# Patient Record
Sex: Male | Born: 1935 | Race: White | Hispanic: No | Marital: Married | Smoking: Never smoker
Health system: Southern US, Community
[De-identification: ages and names within clinical notes are randomized; demographics above are authoritative.]

## PROBLEM LIST (undated history)

## (undated) DIAGNOSIS — G40909 Epilepsy, unspecified, not intractable, without status epilepticus: Secondary | ICD-10-CM

## (undated) DIAGNOSIS — C61 Malignant neoplasm of prostate: Secondary | ICD-10-CM

## (undated) DIAGNOSIS — J45909 Unspecified asthma, uncomplicated: Secondary | ICD-10-CM

## (undated) HISTORY — DX: Malignant neoplasm of prostate: C61

## (undated) HISTORY — DX: Unspecified asthma, uncomplicated: J45.909

## (undated) HISTORY — DX: Epilepsy, unspecified, not intractable, without status epilepticus: G40.909

## (undated) HISTORY — PX: OTHER SURGICAL HISTORY: SHX169

## (undated) HISTORY — PX: KNEE SURGERY: SHX244

## (undated) HISTORY — PX: TONSILLECTOMY: SUR1361

## (undated) HISTORY — PX: EXTERNAL EAR SURGERY: SHX627

---

## 2005-02-10 ENCOUNTER — Ambulatory Visit: Payer: Self-pay | Admitting: Internal Medicine

## 2005-04-23 ENCOUNTER — Ambulatory Visit: Payer: Self-pay | Admitting: Internal Medicine

## 2005-07-29 ENCOUNTER — Ambulatory Visit: Payer: Self-pay | Admitting: Internal Medicine

## 2005-09-22 ENCOUNTER — Ambulatory Visit: Payer: Self-pay | Admitting: Pulmonary Disease

## 2005-10-31 ENCOUNTER — Ambulatory Visit: Payer: Self-pay | Admitting: Internal Medicine

## 2005-12-30 ENCOUNTER — Ambulatory Visit: Payer: Self-pay | Admitting: Internal Medicine

## 2006-03-05 ENCOUNTER — Ambulatory Visit: Payer: Self-pay | Admitting: Internal Medicine

## 2006-08-11 ENCOUNTER — Ambulatory Visit: Payer: Self-pay | Admitting: Internal Medicine

## 2006-09-15 ENCOUNTER — Ambulatory Visit: Payer: Self-pay | Admitting: Internal Medicine

## 2007-05-13 ENCOUNTER — Ambulatory Visit: Payer: Self-pay | Admitting: Internal Medicine

## 2007-05-25 ENCOUNTER — Ambulatory Visit: Payer: Self-pay | Admitting: Internal Medicine

## 2007-09-25 DIAGNOSIS — R569 Unspecified convulsions: Secondary | ICD-10-CM | POA: Insufficient documentation

## 2007-09-25 DIAGNOSIS — J209 Acute bronchitis, unspecified: Secondary | ICD-10-CM | POA: Insufficient documentation

## 2007-09-25 DIAGNOSIS — J309 Allergic rhinitis, unspecified: Secondary | ICD-10-CM | POA: Insufficient documentation

## 2007-09-27 ENCOUNTER — Ambulatory Visit: Payer: Self-pay | Admitting: Internal Medicine

## 2008-03-14 ENCOUNTER — Ambulatory Visit: Payer: Self-pay | Admitting: Internal Medicine

## 2008-08-21 ENCOUNTER — Telehealth: Payer: Self-pay | Admitting: Internal Medicine

## 2008-09-14 ENCOUNTER — Ambulatory Visit: Payer: Self-pay | Admitting: Internal Medicine

## 2008-10-27 ENCOUNTER — Ambulatory Visit: Payer: Self-pay | Admitting: Internal Medicine

## 2009-05-09 ENCOUNTER — Ambulatory Visit: Payer: Self-pay | Admitting: Internal Medicine

## 2009-05-16 ENCOUNTER — Telehealth: Payer: Self-pay | Admitting: Internal Medicine

## 2009-09-13 ENCOUNTER — Telehealth (INDEPENDENT_AMBULATORY_CARE_PROVIDER_SITE_OTHER): Payer: Self-pay | Admitting: *Deleted

## 2009-11-06 ENCOUNTER — Ambulatory Visit: Payer: Self-pay | Admitting: Internal Medicine

## 2010-04-04 ENCOUNTER — Ambulatory Visit: Payer: Self-pay | Admitting: Internal Medicine

## 2010-06-04 ENCOUNTER — Ambulatory Visit: Payer: Self-pay | Admitting: Internal Medicine

## 2010-06-04 DIAGNOSIS — C61 Malignant neoplasm of prostate: Secondary | ICD-10-CM | POA: Insufficient documentation

## 2010-10-23 ENCOUNTER — Telehealth (INDEPENDENT_AMBULATORY_CARE_PROVIDER_SITE_OTHER): Payer: Self-pay | Admitting: *Deleted

## 2010-10-25 ENCOUNTER — Telehealth (INDEPENDENT_AMBULATORY_CARE_PROVIDER_SITE_OTHER): Payer: Self-pay | Admitting: *Deleted

## 2010-10-25 ENCOUNTER — Ambulatory Visit: Payer: Self-pay | Admitting: Internal Medicine

## 2010-10-25 DIAGNOSIS — R042 Hemoptysis: Secondary | ICD-10-CM | POA: Insufficient documentation

## 2010-10-25 DIAGNOSIS — J42 Unspecified chronic bronchitis: Secondary | ICD-10-CM | POA: Insufficient documentation

## 2010-12-27 ENCOUNTER — Ambulatory Visit
Admission: RE | Admit: 2010-12-27 | Discharge: 2010-12-27 | Payer: Self-pay | Source: Home / Self Care | Attending: Internal Medicine | Admitting: Internal Medicine

## 2011-01-02 NOTE — Progress Notes (Signed)
Summary: NEEDS APPT W/ CY   Phone Note Call from Patient Call back at Home Phone [phone]   Caller: Spouse-LINDA Bahena Call For: YOUNG Summary of Call: PT NEEDS AN APPT W/ DR YOUNG "BEFORE 12/13" [facility]).  Initial call taken by: Tivis Ringer, CNA,  October 23, 2010 2:52 PM  Follow-up for Phone Call        Spoke with pt wife and she states pt has to go to [place] to have evaluation for problems related to service. He needs a current OV to take with him to this appt. Pt needs an appt with Dr. Maple Hudson before 11-12-10. Please advise on available time.Carron Curie CMA  October 23, 2010 3:01 PM   Additional Follow-up for Phone Call Additional follow up Details #1::        Spoke with Linda(pts wife)-she will bring pt in Friday 10-25-10 at 10AM appt.Reynaldo Minium CMA  October 23, 2010 4:00 PM

## 2011-01-02 NOTE — Assessment & Plan Note (Signed)
Summary: follow up visit prior to [facility] visit/kcw   Primary Provider/Referring Provider:  Charmaine Downs in [place]  CC:  [facility].[name]  History of Present Illness:  Apr 04, 2010- COPD, allergic rhinitis, hx hemoptysis............................Patrick Kitchenwife here He has had a cold over a week. He is on Cipro now for elevated PSA and did not want to also take the Zpak he has on standby. Did nebulizer twice yesterday- not much effect. Yellow green from nose with trace blood, but no blood from chest. Started with sore throat, sinus headache. Denies fever, chills, nausea, diarrhea.  June 04, 2010- asthmatic bronchitis, allergic rhinitiis, hx hemoptysis..........Patrick Kitchenwife here CXR was clear after last here. He continues to be active outdoors, but aware of some persistent mild chest and head congestion with some white mucus that was more bothersome when he had a persistent cold in March. No more hemoptysis. Dx'd with prostate cancer and asks about surgical candidacy- PFT reviewed today. CXR was clear. PFT-Within normal limits.FEV1/FVC 0.76;  FEV1 2.93/ 100%  October 25, 2010- asthmatic bronchitis, allergic rhinitiis, hx hemoptysis..........Patrick Kitchenwife here Nurse-CC: [facility]. Was short of breath trying to cut some wood, wearing a mask. Had a cold 3-4 weeks ago- yellow sputum. He took his standby script for Z pak. , which he does 2-3x/ year.  Uses Advair 500 two times a day. Uses his rescue inhaler two times a day or more. , uses nebulizer about once daily. Occ has to stop on stairs, but can walk his own pace on level. Daily productive cough, especially at night. Saw maybe streak of blood when he had cold. Sneezes, gets some postnasal drip.  Hx of intense pneumovax reaction after his 3rd, up to date on flu vax.   Preventive Screening-Counseling & Management  Alcohol-Tobacco     Smoking Status: never  Current Medications (verified): 1)   Advair Diskus 500-50 Mcg/dose  Misc (Fluticasone-Salmeterol) .... One Puff Two Times A Day - Rinse Mouth Well After Use 2)  Lipitor 10 Mg  Tabs (Atorvastatin Calcium) .... One By Mouth Once Daily 3)  Singulair 10 Mg  Tabs (Montelukast Sodium) .... One By Mouth Once Daily 4)  Adult Aspirin Ec Low Strength 81 Mg  Tbec (Aspirin) .... Take 1 Tablet By Mouth Once A Day 5)  Mucinex 600 Mg  Tb12 (Guaifenesin) .[name].. 1-2 Every 12 Hours As Needed For Cough and Congestion 6)  Proventil Hfa 108 (90 Base) Mcg/act Aers (Albuterol Sulfate) .... 2 Puffs Four Times A Day As Needed 7)  Nebulizer For Home Meds .... As Directed 8)  Ipratropium-Albuterol 0.5-2.5 (3) Mg/50ml Soln (Ipratropium-Albuterol) .[name].. 1 Neb Four Times A Day As Needed 9)  Vitamin D (Ergocalciferol) 50000 Unit Caps (Ergocalciferol) .... Take 1 Weekly X 12 Weeks 10)  Calcium 500 Mg Tabs (Calcium) .... Take 1 By Mouth Once Daily 11)  Fosamax 70 Mg Tabs (Alendronate Sodium) .... Take 1 By Mouth Weekly 12)  Benadryl 25 Mg Tabs (Diphenhydramine Hcl) .... Take 1 By Mouth Once Daily As Needed  Allergies (verified): 1)  ! * Pna Vaccine  Past History:  Past Medical History: Last updated: 06/04/2010 HEMOPTYSIS (ICD-786.3) Hx of SEIZURE DISORDER (ICD-780.39) ASTHMATIC BRONCHITIS, ACUTE (ICD-466.0) ALLERGIC RHINITIS (ICD-477.9) Prostate cancer- 2011  Past Surgical History: Last updated: 06/04/2010 Ear surgery Tonsillectomy Knee surgery tendon repair right ankle fracture pinned  Family History: Last updated: Jun 07, 2009 Mother- died lung cancer, smoker Father- died COPD, smoker  Sister with cough and nodules  Social History: Last updated: 05/09/2009 Patient never smoked., but grew up in smoking family  Gets some care at [place] married  Risk Factors: Smoking Status: never (10/25/2010)  Review of Systems      See HPI       The patient complains of shortness of breath with activity and productive cough.  The patient denies shortness of  breath at rest, non-productive cough, coughing up blood, chest pain, irregular heartbeats, acid heartburn, indigestion, loss of appetite, weight change, abdominal pain, difficulty swallowing, sore throat, tooth/dental problems, headaches, nasal congestion/difficulty breathing through nose, sneezing, itching, ear ache, rash, change in color of mucus, and fever.    Vital Signs:  Patient profile:   76 year old male Height:      71 inches Weight:      181.13 pounds BMI:     25.35 O2 Sat:      98 % on Room air Pulse rate:   64 / minute BP sitting:   120 / 78  (left arm) Cuff size:   regular  Vitals Entered By: Reynaldo Minium CMA (October 25, 2010 9:58 AM)  O2 Flow:  Room air CC: [facility].   Physical Exam  Additional Exam:  General: A/Ox3; pleasant and cooperative, NAD, SKIN: no rash, lesions NODES: no lymphadenopathy HEENT: [place]/AT, EOM- WNL, Conjuctivae- clear, PERRLA, TM-WNL, Nose- clear, Throat- clear and wnl. Mallampati II NECK: Supple w/ fair ROM, JVD- none, normal carotid impulses w/o bruits Thyroid-  CHEST: No cough, wheeze or rhonchi now. . .  HEART: RRR, no m/g/r heard ABDOMEN: Soft  EAV:WUJW, nl pulses, no edema  NEURO: Grossly intact to observation      Impression & Recommendations:  Problem # 1:  BRONCHITIS, CHRONIC (ICD-491.9)  Current management is sufficient. He is limited by exertional dyspnea and chronic cough, but not needing additional management.  We will let him keep a Zpak in case needed.   Problem # 2:  ALLERGIC RHINITIS (ICD-477.9) Discussed sneeze and postnasal drip at this time of year as mixed etiology, not all allergy. He will try antihistamines.  His updated medication list for this problem includes:    Benadryl 25 Mg Tabs (Diphenhydramine hcl) .[name]... Take 1 by mouth once daily as needed  Problem # 3:  HEMOPTYSIS UNSPECIFIED (ICD-786.30)  He has had hemoprrhagic bronchitis before and never smoked.  CXR was clear of  aciute concern in the Spring. I don't think we need to repeat it this early.  His updated medication list for this problem includes:    Advair Diskus 500-50 Mcg/dose Misc (Fluticasone-salmeterol) ..... One puff two times a day - rinse mouth well after use    Singulair 10 Mg Tabs (Montelukast sodium) ..... One by mouth once daily    Proventil Hfa 108 (90 Base) Mcg/act Aers (Albuterol sulfate) .[name]... 2 puffs four times a day as needed    Ipratropium-albuterol 0.5-2.5 (3) Mg/65ml Soln (Ipratropium-albuterol) .[name]... 1 neb four times a day as needed    Vitamin D (ergocalciferol) 50000 Unit Caps (Ergocalciferol) .[name]... Take 1 weekly x 12 weeks    Calcium 500 Mg Tabs (Calcium) .[name]... Take 1 by mouth once daily    Zithromax Z-pak 250 Mg Tabs (Azithromycin) .[name]... 2 today then one daily  Medications Added to Medication List This Visit: 1)  Calcium 500 Mg Tabs (Calcium) .... Take 1 by mouth once daily 2)  Fosamax 70 Mg Tabs (Alendronate sodium) .... Take 1 by mouth weekly 3)  Benadryl 25 Mg Tabs (  Diphenhydramine hcl) .... Take 1 by mouth once daily as needed 4)  Zithromax Z-pak 250 Mg Tabs (Azithromycin) .... 2 today then one daily  Other Orders: Est. Patient Level IV (13086)  Patient Instructions: 1)  Please schedule a follow-up appointment in 6 months. Call sooner if needed. 2)  Continue present meds 3)  Script for Zpak to hold in case of infection 4)  You are up to date on flu and pneumonia vaccine. Do not repeat the pneumonia vaccine since you had the reaction before. Prescriptions: ZITHROMAX Z-PAK 250 MG TABS (AZITHROMYCIN) 2 today then one daily  #1 pak x 3   Entered and Authorized by:   Waymon Budge MD   Signed by:   Waymon Budge MD on 10/25/2010   Method used:   Print then Give to Patient   RxID:   [id number]

## 2011-01-02 NOTE — Assessment & Plan Note (Signed)
Summary: chest congestion/ mbw   Primary Provider/Referring Provider:  Charmaine Downs in [place]  CC:  nasal pressure; chest congestion-yellow in color;worse at night. .  History of Present Illness: 10/27/08- COPD, allergic rhinitis, hx hemoptysis By late evening everday has developed chest congestion as he lies down. Pains left lower anterior ribs. had similar discomforts off and on before. Had bad cod 2 weeks ago w/ yellow sputum- took amoxacillin. Coughing clear mucus now. Avoids his cat. Has had flu vaccine.  05/10/09- COPD, allergic rhinitis, hx hemoptysis. ................. Wife here Had to go to primary doctor 3 weeks ago for pred taper, neb and Zpak. Blames the season change. Has not fully cleared  Thumping on his chest helps clear mucus. Uses mucinex. White sputum. Denies fever, blood,  chest pain. Felt much better on less humid days. has Flutter device, not using.  November 06, 2009- COPD, allergic rhinitis, hx hemoptysis..................................Marland Kitchenwife here Had a bad cold a month ago.  Has been having more trouble clearing thick mucus from left chest. He wears a dust mask to get up leaves. Cold air bothers. Uses nebulizer- helps when needed. Right now he feels well. Had flu vax. Has had pneumovax- had big red arm.  We gave Z pak that helped earlier in the Fall.  Apr 04, 2010- COPD, allergic rhinitis, hx hemoptysis............................Marland Kitchenwife here He has had a cold over a week. He is on Cipro now for elevated PSA and did not want to also take the Zpak he has on standby. Did nebulizer twice yesterday- not much effect. Yellow green from nose with trace blood, but no blood from chest. Started with sore throat, sinus headache. Denies fever, chills, nausea, diarrhea.   Current Medications (verified): 1)  Advair Diskus 500-50 Mcg/dose  Misc (Fluticasone-Salmeterol) .... One Puff Two Times A Day - Rinse Mouth Well After Use 2)  Lipitor 10 Mg  Tabs (Atorvastatin Calcium) .... One By  Mouth Once Daily 3)  Singulair 10 Mg  Tabs (Montelukast Sodium) .... One By Mouth Once Daily 4)  Adult Aspirin Ec Low Strength 81 Mg  Tbec (Aspirin) .... Take 1 Tablet By Mouth Once A Day 5)  Mucinex 600 Mg  Tb12 (Guaifenesin) .Marland Kitchen.. 1-2 Every 12 Hours As Needed For Cough and Congestion 6)  Proventil Hfa 108 (90 Base) Mcg/act Aers (Albuterol Sulfate) .... 2 Puffs Four Times A Day As Needed 7)  Nebulizer For Home Meds .... As Directed 8)  Ipratropium-Albuterol 0.5-2.5 (3) Mg/9ml Soln (Ipratropium-Albuterol) .Marland Kitchen.. 1 Neb Four Times A Day As Needed 9)  Vitamin D (Ergocalciferol) 50000 Unit Caps (Ergocalciferol) .... Take 1 Weekly X 12 Weeks  Allergies (verified): 1)  ! * Pna Vaccine  Past History:  Past Medical History: Last updated: 03/14/2008 HEMOPTYSIS (ICD-786.3) Hx of SEIZURE DISORDER (ICD-780.39) ASTHMATIC BRONCHITIS, ACUTE (ICD-466.0) ALLERGIC RHINITIS (ICD-477.9)  Past Surgical History: Last updated: 09/14/2008 Ear surgery  Family History: Last updated: May 26, 2009 Mother- died lung cancer, smoker Father- died COPD, smoker  Sister with cough and nodules  Social History: Last updated: 05-26-2009 Patient never smoked., but grew up in smoking family  Gets some care at [place] married  Risk Factors: Smoking Status: never (03/14/2008)  Review of Systems      See HPI  The patient denies anorexia, fever, weight loss, weight gain, vision loss, decreased hearing, hoarseness, chest pain, syncope, dyspnea on exertion, peripheral edema, prolonged cough, headaches, hemoptysis, and severe indigestion/heartburn.    Vital Signs:  Patient profile:   76 year old male Weight:  184 pounds O2 Sat:      96 % on Room air Pulse rate:   66 / minute BP sitting:   144 / 82  (left arm) Cuff size:   regular  Vitals Entered By: Reynaldo Minium CMA (Apr 04, 2010 10:24 AM)  O2 Flow:  Room air  Physical Exam  Additional Exam:  General: A/Ox3; pleasant and cooperative, NAD, SKIN: no rash,  lesions NODES: no lymphadenopathy HEENT: [place]/AT, EOM- WNL, Conjuctivae- clear, PERRLA, TM-WNL, Nose- clear, Throat- clear and wnl. Mallampati II NECK: Supple w/ fair ROM, JVD- none, normal carotid impulses w/o bruits Thyroid-  CHEST: Coarse breath sounds and minimal crackles in left lower and mid lung.  Raspy exhalation.  HEART: RRR, no m/g/r heard ABDOMEN: Soft  VWU:JWJX, nl pulses, no edema  NEURO: Grossly intact to observation      Impression & Recommendations:  Problem # 1:  ASTHMATIC BRONCHITIS, ACUTE (ICD-466.0)  Acute exacerbation of COPD. This is likely a viral infection. His Cipro would cover potential atypicals. We will give neb and depo, get CXR, schedule PFT later to update file anticipating he may need prostate surgery later. The following medications were removed from the medication list:    Zithromax Z-pak 250 Mg Tabs (Azithromycin) .Marland Kitchen... 2 today then one daily    Clarithromycin 500 Mg Tabs (Clarithromycin) .Marland Kitchen... 1 twice daily after meals His updated medication list for this problem includes:    Advair Diskus 500-50 Mcg/dose Misc (Fluticasone-salmeterol) ..... One puff two times a day - rinse mouth well after use    Singulair 10 Mg Tabs (Montelukast sodium) ..... One by mouth once daily    Mucinex 600 Mg Tb12 (Guaifenesin) .Marland Kitchen... 1-2 every 12 hours as needed for cough and congestion    Proventil Hfa 108 (90 Base) Mcg/act Aers (Albuterol sulfate) .Marland Kitchen... 2 puffs four times a day as needed    Ipratropium-albuterol 0.5-2.5 (3) Mg/66ml Soln (Ipratropium-albuterol) .Marland Kitchen... 1 neb four times a day as needed  Medications Added to Medication List This Visit: 1)  Vitamin D (ergocalciferol) 50000 Unit Caps (Ergocalciferol) .... Take 1 weekly x 12 weeks  Other Orders: Est. Patient Level III (91478) Admin of Therapeutic Inj  intramuscular or subcutaneous (29562) Depo- Medrol 80mg  (J1040) Nebulizer Tx (13086) T-2 View CXR (71020TC)  Patient Instructions: 1)  Please schedule a  follow-up appointment in 2 months. 2)  Schedule PFT 3)  A chest x-ray has been recommended.  Your imaging study may require preauthorization.  4)  Finish Cipro, take enough fluids and get enough rest 5)  The mucinex helps keep secretions thin 6)  depo 80 7)  neb xop 1.25     Medication Administration  Injection # 1:    Medication: Depo- Medrol 80mg     Diagnosis: ASTHMATIC BRONCHITIS, ACUTE (ICD-466.0)    Route: SQ    Site: RUOQ gluteus    Exp Date: 12/2010    Lot #: [id number]    Mfr: Pharmacia    Patient tolerated injection without complications    Given by: Reynaldo Minium CMA (Apr 04, 2010 10:59 AM)  Medication # 1:    Medication: Xopenex 1.25mg     Diagnosis: ASTHMATIC BRONCHITIS, ACUTE (ICD-466.0)    Dose: 1 vial    Route: inhaled    Exp Date: 08/2010    Lot #: [id number]    Mfr: Sepracor    Patient tolerated medication without complications    Given by: Reynaldo Minium CMA (Apr 04, 2010 11:00 AM)  Orders Added: 1)  Est. Patient Level III [08676] 2)  Admin of Therapeutic Inj  intramuscular or subcutaneous [96372] 3)  Depo- Medrol 80mg  [J1040] 4)  Nebulizer Tx [94640] 5)  T-2 View CXR [71020TC]

## 2011-01-02 NOTE — Miscellaneous (Signed)
Summary: Orders Update pft charges  Clinical Lists Changes  Orders: Added new Service order of Carbon Monoxide diffusing w/capacity (94720) - Signed Added new Service order of Lung Volumes (94240) - Signed Added new Service order of Spirometry (Pre & Post) (94060) - Signed 

## 2011-01-02 NOTE — Progress Notes (Signed)
  Phone Note Other Incoming   Request: Send information Summary of Call: Records release for records from 2008-present, forwarded to [facility].

## 2011-01-02 NOTE — Assessment & Plan Note (Signed)
Summary: ROV AFTER PFT ///KP   Primary Provider/Referring Provider:  Charmaine Downs in [place]  CC:  Follow up visit-Review PFTs.  History of Present Illness: 05/10/09- COPD, allergic rhinitis, hx hemoptysis. ................. Wife here Had to go to primary doctor 3 weeks ago for pred taper, neb and Zpak. Blames the season change. Has not fully cleared  Thumping on his chest helps clear mucus. Uses mucinex. White sputum. Denies fever, blood,  chest pain. Felt much better on less humid days. has Flutter device, not using.  November 06, 2009- COPD, allergic rhinitis, hx hemoptysis..................................Marland Kitchenwife here Had a bad cold a month ago.  Has been having more trouble clearing thick mucus from left chest. He wears a dust mask to get up leaves. Cold air bothers. Uses nebulizer- helps when needed. Right now he feels well. Had flu vax. Has had pneumovax- had big red arm.  We gave Z pak that helped earlier in the Fall.  Apr 04, 2010- COPD, allergic rhinitis, hx hemoptysis............................Marland Kitchenwife here He has had a cold over a week. He is on Cipro now for elevated PSA and did not want to also take the Zpak he has on standby. Did nebulizer twice yesterday- not much effect. Yellow green from nose with trace blood, but no blood from chest. Started with sore throat, sinus headache. Denies fever, chills, nausea, diarrhea.  June 04, 2010- asthmatic bronchitis, allergic rhinitiis, hx hemoptysis..........Marland Kitchenwife here CXR was clear after last here. He continues to be active outdoors, but aware of some persistent mild chest and head congestion with some white mucus that was more bothersome when he had a persistent cold in March. No more hemoptysis. Dx'd with prostate cancer and asks about surgical candidacy- PFT reviewed today. CXR was clear. PFT-Within normal limits.FEV1/FVC 0.76;  FEV1 2.93/ 100%    Preventive Screening-Counseling & Management  Alcohol-Tobacco     Smoking Status:  never  Current Medications (verified): 1)  Advair Diskus 500-50 Mcg/dose  Misc (Fluticasone-Salmeterol) .... One Puff Two Times A Day - Rinse Mouth Well After Use 2)  Lipitor 10 Mg  Tabs (Atorvastatin Calcium) .... One By Mouth Once Daily 3)  Singulair 10 Mg  Tabs (Montelukast Sodium) .... One By Mouth Once Daily 4)  Adult Aspirin Ec Low Strength 81 Mg  Tbec (Aspirin) .... Take 1 Tablet By Mouth Once A Day 5)  Mucinex 600 Mg  Tb12 (Guaifenesin) .Marland Kitchen.. 1-2 Every 12 Hours As Needed For Cough and Congestion 6)  Proventil Hfa 108 (90 Base) Mcg/act Aers (Albuterol Sulfate) .... 2 Puffs Four Times A Day As Needed 7)  Nebulizer For Home Meds .... As Directed 8)  Ipratropium-Albuterol 0.5-2.5 (3) Mg/52ml Soln (Ipratropium-Albuterol) .Marland Kitchen.. 1 Neb Four Times A Day As Needed 9)  Vitamin D (Ergocalciferol) 50000 Unit Caps (Ergocalciferol) .... Take 1 Weekly X 12 Weeks  Allergies (verified): 1)  ! * Pna Vaccine  Past History:  Family History: Last updated: 05-15-2009 Mother- died lung cancer, smoker Father- died COPD, smoker  Sister with cough and nodules  Social History: Last updated: 05/15/09 Patient never smoked., but grew up in smoking family  Gets some care at [place] married  Risk Factors: Smoking Status: never (06/04/2010)  Past Medical History: HEMOPTYSIS (ICD-786.3) Hx of SEIZURE DISORDER (ICD-780.39) ASTHMATIC BRONCHITIS, ACUTE (ICD-466.0) ALLERGIC RHINITIS (ICD-477.9) Prostate cancer- 2011  Past Surgical History: Ear surgery Tonsillectomy Knee surgery tendon repair right ankle fracture pinned  Review of Systems      See HPI       The patient  complains of non-productive cough.  The patient denies shortness of breath with activity, shortness of breath at rest, productive cough, coughing up blood, chest pain, irregular heartbeats, acid heartburn, indigestion, loss of appetite, weight change, abdominal pain, difficulty swallowing, sore throat, tooth/dental problems, headaches,  nasal congestion/difficulty breathing through nose, and sneezing.    Vital Signs:  Patient profile:   76 year old male Height:      71 inches Weight:      184 pounds BMI:     25.76 O2 Sat:      95 % on Room air Pulse rate:   67 / minute BP sitting:   130 / 82  (right arm) Cuff size:   regular  Vitals Entered By: Reynaldo Minium CMA (June 04, 2010 10:44 AM)  O2 Flow:  Room air CC: Follow up visit-Review PFTs   Physical Exam  Additional Exam:  General: A/Ox3; pleasant and cooperative, NAD, SKIN: no rash, lesions NODES: no lymphadenopathy HEENT: [place]/AT, EOM- WNL, Conjuctivae- clear, PERRLA, TM-WNL, Nose- clear, Throat- clear and wnl. Mallampati II NECK: Supple w/ fair ROM, JVD- none, normal carotid impulses w/o bruits Thyroid-  CHEST: Coarse breath sounds and minimal crackles in left lower and mid lung.  Raspy exhalation.  HEART: RRR, no m/g/r heard ABDOMEN: Soft  UXL:KGMW, nl pulses, no edema  NEURO: Grossly intact to observation      CXR  Procedure date:  04/05/2010  Findings:      DG CHEST 2 VIEW - [id number]   Clinical Data: Cough.   CHEST - 2 VIEW   Comparison: Chest 05/25/2007.   Findings: Lungs are clear.  No effusion.  Heart size normal.   IMPRESSION: No acute disease.   Read By:  Charyl Dancer,  M.D.     Released By:  Charyl Dancer,  M.D.  _____________________________________________________________________  External Attachment:    Type:     Image     Comment:  DG CHEST 2 VIEW - [id number]  Signed by Waymon Budge MD on 04/05/2010 at 9:26 AM   Impression & Recommendations:  Problem # 1:  ASTHMATIC BRONCHITIS, ACUTE (ICD-466.0)  Chronic bronchitis, with some reversibility in small airways. I think he is correct that air quality is a factor as he spends a lot of time outdoors. He never smoked and a background of asthma  is contributory.Advair may be making him hoarser, but he is satisfied as long as his breathing stays  controlled. His updated medication list for this problem includes:    Advair Diskus 500-50 Mcg/dose Misc (Fluticasone-salmeterol) ..... One puff two times a day - rinse mouth well after use    Singulair 10 Mg Tabs (Montelukast sodium) ..... One by mouth once daily    Mucinex 600 Mg Tb12 (Guaifenesin) .Marland Kitchen... 1-2 every 12 hours as needed for cough and congestion    Proventil Hfa 108 (90 Base) Mcg/act Aers (Albuterol sulfate) .Marland Kitchen... 2 puffs four times a day as needed    Ipratropium-albuterol 0.5-2.5 (3) Mg/72ml Soln (Ipratropium-albuterol) .Marland Kitchen... 1 neb four times a day as needed  Problem # 2:  ADENOCARCINOMA, PROSTATE (ICD-185)  He appears to be stable and appropriate for GOT/ radical prostatectomy if that is what he chooses to do.  Problem # 3:  ALLERGIC RHINITIS (ICD-477.9)  No active sinusitis now. There may be a minimal rhinitis.  Orders: Est. Patient Level IV (25956)  Patient Instructions: 1)  Please schedule a follow-up appointment in 6 months. 2)  You appear to be stable now  for surgery if you need to have it.     CXR  Procedure date:  04/05/2010  Findings:      DG CHEST 2 VIEW - [id number]   Clinical Data: Cough.   CHEST - 2 VIEW   Comparison: Chest 05/25/2007.   Findings: Lungs are clear.  No effusion.  Heart size normal.   IMPRESSION: No acute disease.   Read By:  Charyl Dancer,  M.D.     Released By:  Charyl Dancer,  M.D.  _____________________________________________________________________  External Attachment:    Type:     Image     Comment:  DG CHEST 2 VIEW - [id number]  Signed by Waymon Budge MD on 04/05/2010 at 9:26 AM

## 2011-01-06 ENCOUNTER — Telehealth (INDEPENDENT_AMBULATORY_CARE_PROVIDER_SITE_OTHER): Payer: Self-pay | Admitting: *Deleted

## 2011-01-08 NOTE — Assessment & Plan Note (Signed)
Summary: asthma/jd   Primary Provider/Referring Provider:  Charmaine Downs in [place]  CC:  Acute visit-? asthma flare up; full feelings in Left Lung area..  History of Present Illness: June 04, 2010- asthmatic bronchitis, allergic rhinitiis, hx hemoptysis..........Marland Kitchenwife here CXR was clear after last here. He continues to be active outdoors, but aware of some persistent mild chest and head congestion with some white mucus that was more bothersome when he had a persistent cold in March. No more hemoptysis. Dx'd with prostate cancer and asks about surgical candidacy- PFT reviewed today. CXR was clear. PFT-Within normal limits.FEV1/FVC 0.76;  FEV1 2.93/ 100%  October 25, 2010- asthmatic bronchitis, allergic rhinitiis, hx hemoptysis..........Marland Kitchenwife here Nurse-CC: [facility]. Was short of breath trying to cut some wood, wearing a mask. Had a cold 3-4 weeks ago- yellow sputum. He took his standby script for Z pak. , which he does 2-3x/ year.  Uses Advair 500 two times a day. Uses his rescue inhaler two times a day or more. , uses nebulizer about once daily. Occ has to stop on stairs, but can walk his own pace on level. Daily productive cough, especially at night. Saw maybe streak of blood when he had cold. Sneezes, gets some postnasal drip.  Hx of intense pneumovax reaction after his 3rd, up to date on flu vax.   December 27, 2010-  asthmatic bronchitis, allergic rhinitiis, hx hemoptysis..........Marland Kitchenwife here Nurse-CC: Acute visit-? asthma flare up; full feelings in Left Lung area. Acute visit- 3 weeks malaise, feeling chest congestion, productive cough clear mucus, no fever or sore throat. He feels this in lower aspect of both lungs. Takes mucinex.    Preventive Screening-Counseling & Management  Alcohol-Tobacco     Smoking Status: never  Current Medications (verified): 1)  Advair Diskus 500-50 Mcg/dose  Misc (Fluticasone-Salmeterol) .... One Puff  Two Times A Day - Rinse Mouth Well After Use 2)  Lipitor 10 Mg  Tabs (Atorvastatin Calcium) .... One By Mouth Once Daily 3)  Singulair 10 Mg  Tabs (Montelukast Sodium) .... One By Mouth Once Daily 4)  Adult Aspirin Ec Low Strength 81 Mg  Tbec (Aspirin) .... Take 1 Tablet By Mouth Once A Day 5)  Mucinex 600 Mg  Tb12 (Guaifenesin) .Marland Kitchen.. 1-2 Every 12 Hours As Needed For Cough and Congestion 6)  Proventil Hfa 108 (90 Base) Mcg/act Aers (Albuterol Sulfate) .... 2 Puffs Four Times A Day As Needed 7)  Nebulizer For Home Meds .... As Directed 8)  Ipratropium-Albuterol 0.5-2.5 (3) Mg/10ml Soln (Ipratropium-Albuterol) .Marland Kitchen.. 1 Neb Four Times A Day As Needed 9)  Vitamin D 1000 Unit Tabs (Cholecalciferol) .... Take 1 By Mouth Once Daily 10)  Calcium 500 Mg Tabs (Calcium) .... Take 1 By Mouth Once Daily 11)  Fosamax 70 Mg Tabs (Alendronate Sodium) .... Take 1 By Mouth Weekly 12)  Benadryl 25 Mg Tabs (Diphenhydramine Hcl) .... Take 1 By Mouth Once Daily As Needed 13)  Zithromax Z-Pak 250 Mg Tabs (Azithromycin) .... 2 Today Then One Daily  Allergies (verified): 1)  ! * Pna Vaccine  Past History:  Past Medical History: Last updated: 06/04/2010 HEMOPTYSIS (ICD-786.3) Hx of SEIZURE DISORDER (ICD-780.39) ASTHMATIC BRONCHITIS, ACUTE (ICD-466.0) ALLERGIC RHINITIS (ICD-477.9) Prostate cancer- 2011  Past Surgical History: Last updated: 06/04/2010 Ear surgery Tonsillectomy Knee surgery tendon repair right ankle fracture pinned  Family History: Last updated: 2009/05/15 Mother- died lung cancer, smoker Father- died COPD, smoker  Sister with cough and nodules  Social History: Last updated:  05/09/2009 Patient never smoked., but grew up in smoking family  Gets some care at [place] married  Risk Factors: Smoking Status: never (12/27/2010)  Review of Systems      See HPI       The patient complains of shortness of breath with activity, productive cough, non-productive cough, and chest pain.  The patient  denies shortness of breath at rest, coughing up blood, irregular heartbeats, acid heartburn, indigestion, loss of appetite, weight change, abdominal pain, difficulty swallowing, sore throat, tooth/dental problems, headaches, nasal congestion/difficulty breathing through nose, and sneezing.    Vital Signs:  Patient profile:   76 year old male Height:      71 inches Weight:      183.38 pounds BMI:     25.67 O2 Sat:      97 % on Room air Pulse rate:   68 / minute BP sitting:   122 / 78  (left arm) Cuff size:   regular  Vitals Entered By: Reynaldo Minium CMA (December 27, 2010 1:56 PM)  O2 Flow:  Room air CC: Acute visit-? asthma flare up; full feelings in Left Lung area.   Physical Exam  Additional Exam:  General: A/Ox3; pleasant and cooperative, NAD, SKIN: no rash, lesions NODES: no lymphadenopathy HEENT: [place]/AT, EOM- WNL, Conjuctivae- clear, PERRLA, TM-WNL, Nose- clear, Throat- clear and wnl. Mallampati II NECK: Supple w/ fair ROM, JVD- none, normal carotid impulses w/o bruits Thyroid-  CHEST: No cough, wheeze or rhonchi now. . .  HEART: RRR, no m/g/r heard ABDOMEN: Soft  ZOX:WRUE, nl pulses, no edema  NEURO: Grossly intact to observation      Impression & Recommendations:  Problem # 1:  ASTHMATIC BRONCHITIS, ACUTE (ICD-466.0)  Acute exacerbation of his chronic bronchitis. We discussed options. Will give depo now, script for Z pak and script to hold for pred taper if needed. He has a nebulizer at home.  His updated medication list for this problem includes:    Advair Diskus 500-50 Mcg/dose Misc (Fluticasone-salmeterol) ..... One puff two times a day - rinse mouth well after use    Singulair 10 Mg Tabs (Montelukast sodium) ..... One by mouth once daily    Mucinex 600 Mg Tb12 (Guaifenesin) .Marland Kitchen... 1-2 every 12 hours as needed for cough and congestion    Proventil Hfa 108 (90 Base) Mcg/act Aers (Albuterol sulfate) .Marland Kitchen... 2 puffs four times a day as needed    Ipratropium-albuterol  0.5-2.5 (3) Mg/98ml Soln (Ipratropium-albuterol) .Marland Kitchen... 1 neb four times a day as needed    Zithromax Z-pak 250 Mg Tabs (Azithromycin) .Marland Kitchen... 2 today then one daily  Problem # 2:  HEMOPTYSIS UNSPECIFIED (ICD-786.30) There has been no recurrence, favoring our impression this has been a benign hemorrhagic bronchits problem in the past.  His updated medication list for this problem includes:    Advair Diskus 500-50 Mcg/dose Misc (Fluticasone-salmeterol) ..... One puff two times a day - rinse mouth well after use    Singulair 10 Mg Tabs (Montelukast sodium) ..... One by mouth once daily    Proventil Hfa 108 (90 Base) Mcg/act Aers (Albuterol sulfate) .Marland Kitchen... 2 puffs four times a day as needed    Ipratropium-albuterol 0.5-2.5 (3) Mg/22ml Soln (Ipratropium-albuterol) .Marland Kitchen... 1 neb four times a day as needed    Vitamin D 1000 Unit Tabs (Cholecalciferol) .Marland Kitchen... Take 1 by mouth once daily    Calcium 500 Mg Tabs (Calcium) .Marland Kitchen... Take 1 by mouth once daily    Zithromax Z-pak 250 Mg Tabs (Azithromycin) .Marland KitchenMarland KitchenMarland KitchenMarland Kitchen 2  today then one daily    Prednisone 10 Mg Tabs (Prednisone) .Marland Kitchen... 1 tab four times daily x 2 days, 3 times daily x 2 days, 2 times daily x 2 days, 1 time daily x 2 days  Medications Added to Medication List This Visit: 1)  Vitamin D 1000 Unit Tabs (Cholecalciferol) .... Take 1 by mouth once daily 2)  Prednisone 10 Mg Tabs (Prednisone) .Marland Kitchen.. 1 tab four times daily x 2 days, 3 times daily x 2 days, 2 times daily x 2 days, 1 time daily x 2 days  Other Orders: Est. Patient Level III (04540) Depo- Medrol 80mg  (J1040) Admin of Therapeutic Inj  intramuscular or subcutaneous (98119)  Patient Instructions: 1)  Please schedule a follow-up appointment in 6 months. 2)  Script to hld for prednisone taper 3)  Script for Z pak 4)  Depo 80 5)  Encourage fluids and rest Prescriptions: PREDNISONE 10 MG TABS (PREDNISONE) 1 tab four times daily x 2 days, 3 times daily x 2 days, 2 times daily x 2 days, 1 time daily x 2 days   #20 x 0   Entered and Authorized by:   Waymon Budge MD   Signed by:   Waymon Budge MD on 12/27/2010   Method used:   Print then Give to Patient   RxID:   [id number] ZITHROMAX Z-PAK 250 MG TABS (AZITHROMYCIN) 2 today then one daily  #1 pak x 3   Entered and Authorized by:   Waymon Budge MD   Signed by:   Waymon Budge MD on 12/27/2010   Method used:   Print then Give to Patient   RxID:   [id number]      Medication Administration  Injection # 1:    Medication: Depo- Medrol 80mg     Diagnosis: BRONCHITIS, CHRONIC (ICD-491.9)    Route: IM    Site: RUOQ gluteus    Exp Date: 05/2013    Lot #: [id number]    Mfr: Pharmacia    Patient tolerated injection without complications    Given by: Randell Loop CMA (December 27, 2010 2:35 PM)  Orders Added: 1)  Est. Patient Level III [01027] 2)  Depo- Medrol 80mg  [J1040] 3)  Admin of Therapeutic Inj  intramuscular or subcutaneous [25366]

## 2011-01-13 ENCOUNTER — Encounter: Payer: Self-pay | Admitting: Internal Medicine

## 2011-01-13 ENCOUNTER — Ambulatory Visit (INDEPENDENT_AMBULATORY_CARE_PROVIDER_SITE_OTHER): Payer: Medicare Other | Admitting: Internal Medicine

## 2011-01-13 ENCOUNTER — Other Ambulatory Visit: Payer: Self-pay | Admitting: Internal Medicine

## 2011-01-13 ENCOUNTER — Ambulatory Visit (INDEPENDENT_AMBULATORY_CARE_PROVIDER_SITE_OTHER)
Admission: RE | Admit: 2011-01-13 | Discharge: 2011-01-13 | Disposition: A | Discharge status: Home / Self Care | Payer: Medicare Other | Source: Ambulatory Visit | Attending: Internal Medicine | Admitting: Internal Medicine

## 2011-01-13 DIAGNOSIS — J309 Allergic rhinitis, unspecified: Secondary | ICD-10-CM

## 2011-01-13 DIAGNOSIS — J209 Acute bronchitis, unspecified: Secondary | ICD-10-CM

## 2011-01-13 DIAGNOSIS — J42 Unspecified chronic bronchitis: Secondary | ICD-10-CM

## 2011-01-16 NOTE — Progress Notes (Signed)
Summary: fluid in lungs  Phone Note Call from Patient Call back at Home Phone [phone]   Caller: Lucienne Minks Call For: young Summary of Call: pt still having discomfort in lungs. has had depo / zpac and prednisone. pls advise. ([facility] so [facility] may need to [facility] if a rx is called in) [facility] pharmacy Initial call taken by: Tivis Ringer, CNA,  January 06, 2011 1:20 PM  Follow-up for Phone Call        Spoke with pt's spouse.  Pt is c/o feeling "uncomfortable in lungs"- feels like he has fluid in lungs.  She states that fluid is coming up and is clear but he has no cough.  Denies any SOB but c/o feeling very tired. He has 1 day remaining on pred taper and spouse wants to know if needs more called in. Pls advise thanks Follow-up by: Vernie Murders,  January 06, 2011 2:43 PM  Additional Follow-up for Phone Call Additional follow up Details #1::        Per CDY-okay to extend prednisone 10mg  1 by mouth daily x 7days # 7 no refills.Reynaldo Minium CMA  January 06, 2011 3:00 PM     Additional Follow-up for Phone Call Additional follow up Details #2::    Rx was sent to pharm.  Spoke with pt's spouse Bonita Quin and advised this was done.  Follow-up by: Vernie Murders,  January 06, 2011 3:12 PM  New/Updated Medications: PREDNISONE 10 MG TABS (PREDNISONE) 1 once daily x 7 days then stop Prescriptions: PREDNISONE 10 MG TABS (PREDNISONE) 1 once daily x 7 days then stop  #7 x 0   Entered by:   Vernie Murders   Authorized by:   Waymon Budge MD   Signed by:   Vernie Murders on 01/06/2011   Method used:   Electronically to        [facility] (retail)       [address]       [place], [place]  [address]       Ph: [phone] or [phone]       Fax: [phone]   RxID:   [id number]

## 2011-01-21 ENCOUNTER — Encounter: Payer: Self-pay | Admitting: Internal Medicine

## 2011-01-22 NOTE — Assessment & Plan Note (Signed)
Summary: PER WIFE'S CALL/ASTHMA/CONGESTION/[facility]   Primary Provider/Referring Provider:  Charmaine Downs in [place]  CC:  Acute Visit.  still having full feeling in bottom of lungs.  Prod cough with clear mucus.  Wheezing and chest tightness.  Will finish zpak and pred taper tomorrow. and Hypertension Management.  History of Present Illness: October 25, 2010- asthmatic bronchitis, allergic rhinitiis, hx hemoptysis..........Marland Kitchenwife here Nurse-CC: [facility]. Was short of breath trying to cut some wood, wearing a mask. Had a cold 3-4 weeks ago- yellow sputum. He took his standby script for Z pak. , which he does 2-3x/ year.  Uses Advair 500 two times a day. Uses his rescue inhaler two times a day or more. , uses nebulizer about once daily. Occ has to stop on stairs, but can walk his own pace on level. Daily productive cough, especially at night. Saw maybe streak of blood when he had cold. Sneezes, gets some postnasal drip.  Hx of intense pneumovax reaction after his 3rd, up to date on flu vax.   December 27, 2010-  asthmatic bronchitis, allergic rhinitiis, hx hemoptysis..........Marland Kitchenwife here Nurse-CC: Acute visit-? asthma flare up; full feelings in Left Lung area. Acute visit- 3 weeks malaise, feeling chest congestion, productive cough clear mucus, no fever or sore throat. He feels this in lower aspect of both lungs. Takes mucinex.  January 13, 2011- asthmatic bronchitis, allergic rhinitiis, hx hemoptysis..........Marland Kitchenwife here Nurse-CC: Acute Visit.  still having full feeling in bottom of lungs.  Prod cough with clear mucus.  Wheezing and chest tightness.  Will finish zpak and pred taper tomorrow- these were Rx'd on 1/27. If he thumps on ribs, he brings up mucus. For a month now he says he is bringing up mucus at night. Using Flutter- not dramatic help. Using nebulizer two times a day and also Mucinex. Continues Advair 500 two times a day. Denies Glaucoma.      Hypertension History:      Positive major cardiovascular risk factors include male age 16 years old or older.  Negative major cardiovascular risk factors include non-tobacco-user status.    Preventive Screening-Counseling & Management  Alcohol-Tobacco     Smoking Status: never  Current Medications (verified): 1)  Advair Diskus 500-50 Mcg/dose  Misc (Fluticasone-Salmeterol) .... One Puff Two Times A Day - Rinse Mouth Well After Use 2)  Lipitor 10 Mg  Tabs (Atorvastatin Calcium) .... One By Mouth Once Daily 3)  Singulair 10 Mg  Tabs (Montelukast Sodium) .... One By Mouth Once Daily 4)  Adult Aspirin Ec Low Strength 81 Mg  Tbec (Aspirin) .... Take 1 Tablet By Mouth Once A Day 5)  Mucinex 600 Mg  Tb12 (Guaifenesin) .Marland Kitchen.. 1-2 Every 12 Hours As Needed For Cough and Congestion 6)  Proventil Hfa 108 (90 Base) Mcg/act Aers (Albuterol Sulfate) .... 2 Puffs Four Times A Day As Needed 7)  Nebulizer For Home Meds .... As Directed 8)  Ipratropium-Albuterol 0.5-2.5 (3) Mg/46ml Soln (Ipratropium-Albuterol) .Marland Kitchen.. 1 Neb Four Times A Day As Needed 9)  Vitamin D 1000 Unit Tabs (Cholecalciferol) .... Take 1 By Mouth Once Daily 10)  Calcium 500 Mg Tabs (Calcium) .... Take 1 By Mouth Once Daily 11)  Fosamax 70 Mg Tabs (Alendronate Sodium) .... Take 1 By Mouth Weekly 12)  Benadryl 25 Mg Tabs (Diphenhydramine Hcl) .... Take 1 By Mouth Once Daily As Needed 13)  Zithromax Z-Pak 250 Mg Tabs (Azithromycin) .... 2 Today Then One Daily 14)  Prednisone 10 Mg Tabs (Prednisone) .Marland KitchenMarland KitchenMarland Kitchen  1 Once Daily X 7 Days Then Stop  Allergies (verified): 1)  ! * Pna Vaccine  Past History:  Past Medical History: Last updated: 06/04/2010 HEMOPTYSIS (ICD-786.3) Hx of SEIZURE DISORDER (ICD-780.39) ASTHMATIC BRONCHITIS, ACUTE (ICD-466.0) ALLERGIC RHINITIS (ICD-477.9) Prostate cancer- 2011  Past Surgical History: Last updated: 06/04/2010 Ear surgery Tonsillectomy Knee surgery tendon repair right ankle fracture pinned  Family  History: Last updated: 06/08/2009 Mother- died lung cancer, smoker Father- died COPD, smoker  Sister with cough and nodules  Social History: Last updated: 06-08-2009 Patient never smoked., but grew up in smoking family  Gets some care at [place] married  Risk Factors: Smoking Status: never (01/13/2011)  Review of Systems      See HPI  The patient denies shortness of breath with activity, shortness of breath at rest, non-productive cough, coughing up blood, chest pain, irregular heartbeats, acid heartburn, indigestion, loss of appetite, weight change, abdominal pain, difficulty swallowing, sore throat, tooth/dental problems, headaches, nasal congestion/difficulty breathing through nose, and sneezing.    Vital Signs:  Patient profile:   76 year old male Height:      71 inches Weight:      184.50 pounds BMI:     25.83 O2 Sat:      96 % on Room air Pulse rate:   80 / minute BP sitting:   126 / 78  (left arm) Cuff size:   regular  Vitals Entered By: Gweneth Dimitri RN (January 13, 2011 1:40 PM)  O2 Flow:  Room air CC: Acute Visit.  still having full feeling in bottom of lungs.  Prod cough with clear mucus.  Wheezing and chest tightness.  Will finish zpak and pred taper tomorrow., Hypertension Management Comments Medications reviewed with patient Daytime contact number verified with patient. Gweneth Dimitri RN  January 13, 2011 1:38 PM    Physical Exam  Additional Exam:  General: A/Ox3; pleasant and cooperative, NAD, SKIN: no rash, lesions NODES: no lymphadenopathy HEENT: [place]/AT, EOM- WNL, Conjuctivae- clear, PERRLA, TM-WNL, Nose- clear, Throat- clear and wnl. Mallampati II NECK: Supple w/ fair ROM, JVD- none, normal carotid impulses w/o bruits Thyroid-  CHEST: No cough, wheeze or rhonchi now. although he is concerned that there is "fluid" during this exam.  HEART: RRR, no m/g/r heard ABDOMEN: Soft  YNW:GNFA, nl pulses, no edema  NEURO: Grossly intact to  observation      Impression & Recommendations:  Problem # 1:  BRONCHITIS, CHRONIC (ICD-491.9) I don't here rhonchi or rales and he is coughing very little.  He is convinced there is "fluid" and he lies in bed beating on his chest all nitght , trying to knock it loose, but says Flutter doesn't help. We will get CXR. We will try Spiriva, which may have beneficial drying effect. I don't see evidence of fluid overload.   Problem # 2:  HEMOPTYSIS UNSPECIFIED (ICD-786.30) No recurrence so far.  The following medications were removed from the medication list:    Prednisone 10 Mg Tabs (Prednisone) .Marland Kitchen... 1 tab four times daily x 2 days, 3 times daily x 2 days, 2 times daily x 2 days, 1 time daily x 2 days His updated medication list for this problem includes:    Advair Diskus 500-50 Mcg/dose Misc (Fluticasone-salmeterol) ..... One puff two times a day - rinse mouth well after use    Singulair 10 Mg Tabs (Montelukast sodium) ..... One by mouth once daily    Proventil Hfa 108 (90 Base) Mcg/act Aers (Albuterol sulfate) .Marland Kitchen... 2 puffs four  times a day as needed    Ipratropium-albuterol 0.5-2.5 (3) Mg/53ml Soln (Ipratropium-albuterol) .Marland Kitchen... 1 neb four times a day as needed    Vitamin D 1000 Unit Tabs (Cholecalciferol) .Marland Kitchen... Take 1 by mouth once daily    Calcium 500 Mg Tabs (Calcium) .Marland Kitchen... Take 1 by mouth once daily    Zithromax Z-pak 250 Mg Tabs (Azithromycin) .Marland Kitchen... 2 today then one daily    Prednisone 10 Mg Tabs (Prednisone) .Marland Kitchen... 1 once daily x 7 days then stop    Spiriva Handihaler 18 Mcg Caps (Tiotropium bromide monohydrate) .Marland Kitchen... 1 daily  Medications Added to Medication List This Visit: 1)  Spiriva Handihaler 18 Mcg Caps (Tiotropium bromide monohydrate) .Marland Kitchen.. 1 daily  Other Orders: Est. Patient Level III (16109) T-2 View CXR (71020TC)  Hypertension Assessment/Plan:      The patient's hypertensive risk group is category B: At least one risk factor (excluding diabetes) with no target organ  damage.  Today's blood pressure is 126/78.     Patient Instructions: 1)  Please schedule a follow-up appointment in 2 months.  Please call sooner as needed 2)  Sample and script Spiriva, 1 daily 3)  A chest x-ray has been recommended.  Your imaging study may require preauthorization.  4)  refill neb solution Prescriptions: IPRATROPIUM-ALBUTEROL 0.5-2.5 (3) MG/3ML SOLN (IPRATROPIUM-ALBUTEROL) 1 neb four times a day as needed  #25 x prn   Entered and Authorized by:   Waymon Budge MD   Signed by:   Waymon Budge MD on 01/13/2011   Method used:   Print then Give to Patient   RxID:   [id number] SPIRIVA HANDIHALER 18 MCG CAPS (TIOTROPIUM BROMIDE MONOHYDRATE) 1 daily  #30 x prn   Entered and Authorized by:   Waymon Budge MD   Signed by:   Waymon Budge MD on 01/13/2011   Method used:   Print then Give to Patient   RxID:   [id number]

## 2011-01-28 NOTE — Miscellaneous (Signed)
Summary: Orders Update-CXR 2 view for 04-21-11  Clinical Lists Changes  Orders: Added new Test order of T-2 View CXR (71020TC) - Signed

## 2011-01-30 ENCOUNTER — Telehealth (INDEPENDENT_AMBULATORY_CARE_PROVIDER_SITE_OTHER): Payer: Self-pay | Admitting: *Deleted

## 2011-01-31 ENCOUNTER — Other Ambulatory Visit: Payer: Self-pay | Admitting: Internal Medicine

## 2011-01-31 ENCOUNTER — Other Ambulatory Visit: Payer: Medicare Other

## 2011-01-31 ENCOUNTER — Encounter (INDEPENDENT_AMBULATORY_CARE_PROVIDER_SITE_OTHER): Payer: Self-pay | Admitting: *Deleted

## 2011-01-31 ENCOUNTER — Ambulatory Visit (INDEPENDENT_AMBULATORY_CARE_PROVIDER_SITE_OTHER): Payer: Medicare Other | Admitting: Internal Medicine

## 2011-01-31 ENCOUNTER — Encounter: Payer: Self-pay | Admitting: Internal Medicine

## 2011-01-31 DIAGNOSIS — R911 Solitary pulmonary nodule: Secondary | ICD-10-CM

## 2011-01-31 DIAGNOSIS — R0602 Shortness of breath: Secondary | ICD-10-CM

## 2011-01-31 DIAGNOSIS — J40 Bronchitis, not specified as acute or chronic: Secondary | ICD-10-CM

## 2011-01-31 DIAGNOSIS — J42 Unspecified chronic bronchitis: Secondary | ICD-10-CM

## 2011-01-31 DIAGNOSIS — J984 Other disorders of lung: Secondary | ICD-10-CM | POA: Insufficient documentation

## 2011-01-31 LAB — CBC WITH DIFFERENTIAL/PLATELET
Eosinophils Relative: 0.9 % (ref 0.0–5.0)
HCT: 39.6 % (ref 39.0–52.0)
Hemoglobin: 13.8 g/dL (ref 13.0–17.0)
Lymphs Abs: 1.7 10*3/uL (ref 0.7–4.0)
MCHC: 35 g/dL (ref 30.0–36.0)
MCV: 93.4 fl (ref 78.0–100.0)
Monocytes Absolute: 0.7 10*3/uL (ref 0.1–1.0)
Neutrophils Relative %: 66.8 % (ref 43.0–77.0)
RBC: 4.24 Mil/uL (ref 4.22–5.81)
WBC: 7.8 10*3/uL (ref 4.5–10.5)

## 2011-01-31 LAB — BASIC METABOLIC PANEL
BUN: 11 mg/dL (ref 6–23)
Chloride: 102 mEq/L (ref 96–112)

## 2011-02-04 ENCOUNTER — Ambulatory Visit (INDEPENDENT_AMBULATORY_CARE_PROVIDER_SITE_OTHER)
Admission: RE | Admit: 2011-02-04 | Discharge: 2011-02-04 | Disposition: A | Discharge status: Home / Self Care | Payer: Medicare Other | Source: Ambulatory Visit | Attending: Internal Medicine | Admitting: Internal Medicine

## 2011-02-04 DIAGNOSIS — R911 Solitary pulmonary nodule: Secondary | ICD-10-CM

## 2011-02-04 DIAGNOSIS — J984 Other disorders of lung: Secondary | ICD-10-CM

## 2011-02-04 DIAGNOSIS — J40 Bronchitis, not specified as acute or chronic: Secondary | ICD-10-CM

## 2011-02-04 MED ORDER — IOHEXOL 300 MG/ML  SOLN
80.0000 mL | Freq: Once | INTRAMUSCULAR | Status: AC | PRN
  Administered 2011-02-04: 80 mL via INTRAVENOUS

## 2011-02-05 ENCOUNTER — Telehealth (INDEPENDENT_AMBULATORY_CARE_PROVIDER_SITE_OTHER): Payer: Self-pay | Admitting: *Deleted

## 2011-02-05 ENCOUNTER — Telehealth: Payer: Self-pay | Admitting: Internal Medicine

## 2011-02-06 NOTE — Progress Notes (Signed)
Summary: would like to be worked ---ov with CY for tomorrow  Phone Note Call from Patient Call back at [employer] [phone]   Caller: SPouse/LINDA Call For: YOUNG Summary of Call: Pateints spouse phoned stated that he saw Dr Maple Hudson on the 13th and was given a new medicine Spiriva and it did not help. She would like for him to be seen he is still having so much difficulty with his asthma. Please advise when he can be worked in. She can be reached at [phone] Initial call taken by: Vedia Coffer,  January 30, 2011 11:19 AM  Follow-up for Phone Call        Spoke with pt's spouse.  She is concerned that pt is no better since last seen.  Still has alot of chest congestion and "has to beat on chest to get it up".  She states that sometimes he is able to produce minimal clear sputum.  She states pt is very uncomfortable and breathing is also not better.  She wants pt seen tommorrow am.  I advised that nothing available with CDY.  Pls advise if he can be worked in. Thanks  Wife called back and stated that pt thinks he can wait until next week to see CY.Darletta Moll  January 30, 2011 12:57 PM  Follow-up by: Vernie Murders,  January 30, 2011 12:32 PM  Additional Follow-up for Phone Call Additional follow up Details #1::        Per CDY-okay to put pt on at 930am tomorrow; please advise pt that there may be a wait as CDY is booked and starts late in the morning.Reynaldo Minium CMA  January 30, 2011 1:32 PM     Additional Follow-up for Phone Call Additional follow up Details #2::    called and spoke with pt's spouse.  spouse aware of CY's response.  Scheduled pt to see CY tomorrow at 9:30am.  Arman Filter LPN  January 29, 2594 1:49 PM

## 2011-02-11 ENCOUNTER — Telehealth (INDEPENDENT_AMBULATORY_CARE_PROVIDER_SITE_OTHER): Payer: Self-pay | Admitting: *Deleted

## 2011-02-11 NOTE — Progress Notes (Signed)
  Phone Note Other Incoming   Request: Send information Summary of Call: Request for records received from [employer]. Request forwarded to [facility]. All records...beginning 12-02-1987 ending 11-30-1988-Pulmonary

## 2011-02-11 NOTE — Assessment & Plan Note (Signed)
Summary: chest congestion/increased sob/ok per katie/mg   Primary Provider/Referring Provider:  Charmaine Downs in [place]  CC:  Acute visit-tapping on chest to get congestion up; slight SOB.Marland Kitchen  History of Present Illness: January 13, 2011- asthmatic bronchitis, allergic rhinitiis, hx hemoptysis..........Marland Kitchenwife here Nurse-CC: Acute Visit.  still having full feeling in bottom of lungs.  Prod cough with clear mucus.  Wheezing and chest tightness.  Will finish zpak and pred taper tomorrow- these were Rx'd on 1/27. If he thumps on ribs, he brings up mucus. For a month now he says he is bringing up mucus at night. Using Flutter- not dramatic help. Using nebulizer two times a day and also Mucinex. Continues Advair 500 two times a day. Denies Glaucoma.   January 31, 2011- asthmatic bronchitis, allergic rhinitiis, hx hemoptysis..........Marland Kitchenwife here Nurse-CC: Acute visit-tapping on chest to get congestion up; slight SOB. CXR-01/23/11-IMPRESSION: 1.  New density at the left lung base adjacent to the nipple shadow is statistically most likely to represent superimposition of vascular and osseous shadows.  I cannot exclude a true intrapulmonary nodule.  I recommend either close conventional radiographic surveillance (in 1-3 months time) or chest CT for further characterization. 2.  Mild airway thickening compatible with chronic bronchitis. Original Report Authenticated By: Dellia Cloud, M.D. He says left lung has fluid in it and he can'tget it out by pounding on his side. Pain/ funny feeling left lower rib area. Bowels ok. No change chronic occasional sweats, no fever, blood, swollen glands or other change. Says no change in 2 months.      Preventive Screening-Counseling & Management  Alcohol-Tobacco     Smoking Status: never  Current Medications (verified): 1)  Advair Diskus 500-50 Mcg/dose  Misc (Fluticasone-Salmeterol) .... One Puff Two Times A Day - Rinse Mouth Well After Use 2)  Lipitor 10 Mg   Tabs (Atorvastatin Calcium) .... One By Mouth Once Daily 3)  Singulair 10 Mg  Tabs (Montelukast Sodium) .... One By Mouth Once Daily 4)  Adult Aspirin Ec Low Strength 81 Mg  Tbec (Aspirin) .... Take 1 Tablet By Mouth Once A Day 5)  Mucinex 600 Mg  Tb12 (Guaifenesin) .Marland Kitchen.. 1-2 Every 12 Hours As Needed For Cough and Congestion 6)  Proventil Hfa 108 (90 Base) Mcg/act Aers (Albuterol Sulfate) .... 2 Puffs Four Times A Day As Needed 7)  Nebulizer For Home Meds .... As Directed 8)  Ipratropium-Albuterol 0.5-2.5 (3) Mg/31ml Soln (Ipratropium-Albuterol) .Marland Kitchen.. 1 Neb Four Times A Day As Needed 9)  Vitamin D 1000 Unit Tabs (Cholecalciferol) .... Take 1 By Mouth Once Daily 10)  Calcium 500 Mg Tabs (Calcium) .... Take 1 By Mouth Once Daily 11)  Fosamax 70 Mg Tabs (Alendronate Sodium) .... Take 1 By Mouth Weekly 12)  Benadryl 25 Mg Tabs (Diphenhydramine Hcl) .... Take 1 By Mouth Once Daily As Needed  Allergies (verified): 1)  ! * Pna Vaccine  Past History:  Past Medical History: Last updated: 06/04/2010 HEMOPTYSIS (ICD-786.3) Hx of SEIZURE DISORDER (ICD-780.39) ASTHMATIC BRONCHITIS, ACUTE (ICD-466.0) ALLERGIC RHINITIS (ICD-477.9) Prostate cancer- 2011  Past Surgical History: Last updated: 06/04/2010 Ear surgery Tonsillectomy Knee surgery tendon repair right ankle fracture pinned  Family History: Last updated: 2009/05/28 Mother- died lung cancer, smoker Father- died COPD, smoker  Sister with cough and nodules  Social History: Last updated: 05-28-2009 Patient never smoked., but grew up in smoking family  Gets some care at [place] married  Risk Factors: Smoking Status: never (01/31/2011)  Review of Systems      See  HPI       The patient complains of shortness of breath with activity and productive cough.  The patient denies shortness of breath at rest, non-productive cough, coughing up blood, chest pain, irregular heartbeats, acid heartburn, indigestion, loss of appetite, weight change,  abdominal pain, difficulty swallowing, sore throat, tooth/dental problems, headaches, nasal congestion/difficulty breathing through nose, and sneezing.    Vital Signs:  Patient profile:   76 year old male Height:      71 inches Weight:      181.50 pounds BMI:     25.41 O2 Sat:      98 % on Room air Pulse rate:   77 / minute BP sitting:   142 / 76  (left arm) Cuff size:   regular  Vitals Entered By: Vivianne Spence  O2 Flow:  Room air CC: Acute visit-tapping on chest to get congestion up; slight SOB.   Physical Exam  Additional Exam:  General: A/Ox3; pleasant and cooperative, NAD, SKIN: no rash, lesions NODES: no lymphadenopathy HEENT: [place]/AT, EOM- WNL, Conjuctivae- clear, PERRLA, TM-WNL, Nose- clear, Throat- clear and wnl. Mallampati II NECK: Supple w/ fair ROM, JVD- none, normal carotid impulses w/o bruits Thyroid-  CHEST: No cough, wheeze or rhonchi now. although he is concerned that there is "fluid" during this exam. Lungs are clear. HEART: RRR, no m/g/r heard ABDOMEN: Soft . No HSM WUJ:WJXB, nl pulses, no edema  NEURO: Grossly intact to observation      Impression & Recommendations:  Problem # 1:  BRONCHITIS, CHRONIC (ICD-491.9)  He gives an unusual description of discomfort he senses as "fluid" in his airways. I think he is feeling mucus in airways- bronchitis or bronchiectasis, but we will try to clarify this and the questionable nodule on CXR with a CT.  He stopped Spiriva which didn't help and overdried his nose.   Problem # 2:  LUNG NODULE (ICD-518.89) We will clarify with CT chest.   Other Orders: Est. Patient Level III (14782) Radiology Referral (Radiology) TLB-CBC Platelet - w/Differential (85025-CBCD) TLB-BMP (Basic Metabolic Panel-BMET) (80048-METABOL)  Patient Instructions: 1)  Please schedule a follow-up appointment in 1 month. 2)  A Chest CT with Contrast has been recommended.  Your imaging study may require preauthorization.  3)  Ok to stop  Spiriva since it didn't help

## 2011-02-18 NOTE — Progress Notes (Signed)
Summary: ov notes/ CT results  Phone Note Call from Patient Call back at Home Phone [phone]   Caller: Spouse Call For: young Summary of Call: pt needs last four office visits and CT results with diagnosis and treatment recommendations printed out for pick up. call her at home # above when this is available for pick up. caller says she needs to take this info to the [place] this wednesday.  Initial call taken by: Tivis Ringer, CNA,  February 11, 2011 10:34 AM  Follow-up for Phone Call        Mailed records 02/12/11.//Juanita Follow-up by: Darletta Moll,  February 12, 2011 8:03 AM

## 2011-02-18 NOTE — Progress Notes (Signed)
Summary: ct chest results- LMTCB x 1  Phone Note Call from Patient Call back at Home Phone [phone]   Caller: Spouse//linda Call For: Yamna Mackel Summary of Call: Wife called for a 1 month f/u from ov on 3/2 I gave her first available for 4/9 she wants pt to be seen earlier in april pls advise. Initial call taken by: Darletta Moll,  February 05, 2011 10:56 AM  Follow-up for Phone Call        Pls advise thanks Vernie Murders  February 05, 2011 10:57 AM   Please let patient know that with the new system that April 9th is the first open appt with CDY for approx 1 month follow up.They can call the office to see if anyone cancels their appt and we can put pt in that time.Reynaldo Minium CMA  February 05, 2011 12:18 PM   Additional Follow-up for Phone Call Additional follow up Details #1::        Spoke with pt's spouse and advised of the above.  She verbalized understanding, but is now requesting results of CT Chest. Still unsigned, pls advise thanks Additional Follow-up by: Vernie Murders,  February 05, 2011 12:23 PM    Additional Follow-up for Phone Call Additional follow up Details #2::    Ok to give CT result as appended.  Follow-up by: Waymon Budge MD,  February 05, 2011 2:02 PM  Additional Follow-up for Phone Call Additional follow up Details #3:: Details for Additional Follow-up Action Taken: [facility] Vernie Murders  February 05, 2011 2:10 PM   Returning call.  Lehman Prom  February 05, 2011 3:37 PM  Called, spoke with pt as Mr. Plotts was unavailable.  She was informed of CT results per append from 02/05/11 by CDY.  She verbalized understanding and aware this will be discussed in further detail at next OV.  She would like to know if there is anything that can be done before next OV as pt is "uncomfortable all the time."  Dr. Maple Hudson, pls advise.  Thanks! Gweneth Dimitri RN  February 05, 2011 3:50 PM

## 2011-03-10 ENCOUNTER — Ambulatory Visit (INDEPENDENT_AMBULATORY_CARE_PROVIDER_SITE_OTHER): Payer: Medicare Other | Admitting: Internal Medicine

## 2011-03-10 ENCOUNTER — Encounter: Payer: Self-pay | Admitting: Internal Medicine

## 2011-03-10 VITALS — BP 122/74 | HR 63 | Ht 71.0 in | Wt 181.4 lb

## 2011-03-10 DIAGNOSIS — J984 Other disorders of lung: Secondary | ICD-10-CM

## 2011-03-10 DIAGNOSIS — J42 Unspecified chronic bronchitis: Secondary | ICD-10-CM

## 2011-03-10 NOTE — Assessment & Plan Note (Signed)
Objective findings don't correspond well to his complaints. We will update his CT in August. Pet scan wouldn't be able to see these small nodules. I think most of his complaint is about a mild bronchitis with some mucus production. Imaging has not shown anything like the sort of lung abscess he first seemed to suspect.

## 2011-03-10 NOTE — Progress Notes (Signed)
  Subjective:    Patient ID: [name], male    DOB: 08/19/1935, 76 y.o.   MRN: [id number]  HPI 69 yoM never smoker, here with wife for f/u of chronic bronchitis with small lung nodules. Cough remains productive, if he beats on his own chest, especially at night.He indicates he is feeling much better about the cough and sense of mucus needing to be dislodged- may be time of year.   We reviewed results of Chest CT 02/04/11 showing a few small nodules. Radiologist recommended f/u 6-12 months. He has a family member who is a primary care physician who suggested a PET scan, but I explained that these lesions are too small at this point.     Review of Systems See HPI Constitutional:   No weight loss, night sweats,  Fevers, chills, fatigue, lassitude. HEENT:   No headaches,  Difficulty swallowing,  Tooth/dental problems,  Sore throat,                No sneezing, itching, ear ache, nasal congestion, post nasal drip,   CV:  No chest pain,  Orthopnea, PND, swelling in lower extremities, anasarca, dizziness, palpitations  GI  No heartburn, indigestion, abdominal pain, nausea, vomiting, diarrhea, change in bowel habits, loss of appetite  Resp:Some shortness of breath with exertion and at rest.  No excess mucus, but productive cough if he does aggressive chest PT on himself, thumping chest. ,  No non-productive cough,  No coughing up of blood.  No change in color of mucus.  No wheezing.  Skin: no rash or lesions.  GU: no dysuria, change in color of urine, no urgency or frequency.  No flank pain.  MS:  No joint pain or swelling.  No decreased range of motion.  No back pain.  Psych:  No change in mood or affect. No depression or anxiety.  No memory loss.      Objective:   Physical Exam    General- Alert, Oriented, Affect-appropriate, Distress- none acute  Skin- rash-none, lesions- none, excoriation- none  Lymphadenopathy- none  Head- atraumatic  Eyes- Gross vision intact, PERRLA,  conjunctivae clear secretions, periorbital edema/ bags under eyes  Ears- Normal-Hearing, canals, Tm L ,   R ,  Nose- Clear,  No-Septal dev, mucus, polyps, erosion, perforation   Throat- Mallampati II , mucosa clear , drainage- none, tonsils- atrophic  Neck- flexible , trachea midline, no stridor , thyroid nl, carotid no bruit  Chest - symmetrical excursion , unlabored     Heart/CV- RRR , no murmur , no gallop  , no rub, nl s1 s2                     - JVD- none , edema- none, stasis changes- none, varices- none     Lung- clear to P&A, wheeze- none, cough- none , dullness-none, rub- none. I hear nothing corresponding to his sense of chest discomfort.      Chest wall-   Abd- tender-no, distended-no, bowel sounds-present, HSM- no  Br/ Gen/ Rectal- Not done, not indicated  Extrem- cyanosis- none, clubbing, none, atrophy- none, strength- nl  Neuro- grossly intact to observation      Assessment & Plan:

## 2011-03-10 NOTE — Patient Instructions (Signed)
When we get you back in August, we will arrange a follow-up CT scan of you chest.  Please call sooner as needed.   It will help you to walk a lot or do any other activity that gets you breathing deeply.

## 2011-03-13 ENCOUNTER — Encounter: Payer: Self-pay | Admitting: Internal Medicine

## 2011-04-15 NOTE — Assessment & Plan Note (Signed)
[facility]                             PULMONARY OFFICE NOTE   NAME:[name], [name]                      MRN:          [id number]  DATE:05/13/2007                            DOB:          Oct 11, 1935    HISTORY:  This is a 76 year old white male never smoker who has carried  a diagnosis of chronic asthmatic bronchitis with questionable  bronchiectasis on the basis of the sense that he gets that he has  congestion in his left chest which he knocks loose by percussion.  This problem goes back 15 or 20 years apparently. He has been maintained  on high doses of Advair and comes in today with the increasing symptoms  of sore throat, chest congestion, increased dyspnea for the last  several weeks, but is coughing up purely thick white mucus,in minimal  amts, no purulence. He has been also using albuterol b.i.d. (not p.r.n.)  and Mucinex 1 nightly (not p.r.n.). He has not been using the flutter  valve that was recommended by Dr. Fortunato Curling.   The patient denies any pleuritic pain, purulent sputum, fevers, chills,  sweats, orthopnea, PND, or leg swelling. He does complain of dry mouth  in the morning and nasal congestion but no typical nocturnal  disturbance. He also tells me that he has ear tubes because his tubes  are blocked up internally. He does not recall his ear, nose, and throat  doctor ever mentioning any sinus disease however.   PHYSICAL EXAMINATION:  GENERAL:  He is an anxious somber white male that  at times did not really appear to process the questions being asked or  respond in a straight forward manner.  VITAL SIGNS:  Afebrile, normal.  HEENT:  Reveals moderate turbinate edema with dry mucosa. Oropharynx is  clear. There is no excessive post-nasal drainage or cobblestoning.  NECK:  Supple without cervical adenopathy or tenderness. Trachea is  midline. No lymphadenopathy.  LUNGS:  Completely clear bilaterally to auscultation and percussion  except for minimum pseudo wheeze.  CARDIAC:  Regular rate and rhythm, no murmurs, rubs, or gallops.  ABDOMEN:  Soft and benign.  EXTREMITIES:  Warm without calf tenderness, cyanosis, clubbing, or  edema.   IMPRESSION:  Chronic cough with minimal actual excess sputum production  and when he does produce excess mucus it is typically in the evening,  not in the mornings that is typical of a patient with mucociliary  dysfunction, which is I believe, the working diagnosis.   I believe that he has unaddressed upper airways inflammation and also  possibly unaddressed reflux, and/or adverse effects from Advair based on  the non-specific complaints that he has, that on the surface might  suggest an exacerbation (except for the fact that he has had no fever or  purulent sputum).   To test this hypothesis I recommend the following:  Stop Advair now and challenge with Symbicort 80/4.5 2 puffs b.i.d. (I  checked his MDI technique and it is actually quite good). Take Prilosec  perfectly regularly for the next month before breakfast every day, 30  minutes before breakfast, and follow  a GERD diet.   If he is having cough and congestion, treat it with the maximum dose  of Mucinex which is 1-2 every 12 hours not 1 nightly, and if he does  become short of breath or feels like he is wheezing, use albuterol 1-2  every 4 hours.   If this does not resolve his congestion the next step I believe would  be a sinus and chest CT scan on follow up with Dr. Maple Hudson and I have  asked him to call to schedule this after the month trial of Symbicort.  If all else fails, he can always go back to doing things the way he was.   The patient and his wife showed a great amount of hesitance to change  course, despite the fact that he has clearly been going downhill in  terms of symptom control over the last several weeks, if not several  months. I spent extra time with this patient, 15-25 minutes, just going  over the  changes, in writing, in detail, and emphasizing the reasoning  behind them.     Charlaine Dalton. Sherene Sires, MD, [facility]  Electronically Signed    MBW/MedQ  DD: 05/13/2007  DT: 05/14/2007  Job #: [id number]   cc:   Theron Arista MD [place] Caster D. Maple Hudson, MD, FCCP, FACP

## 2011-04-15 NOTE — Assessment & Plan Note (Signed)
[facility]                             PULMONARY OFFICE NOTE   NAME:[name], [name]                      MRN:          [id number]  DATE:05/25/2007                            DOB:          [birth year]    PROBLEM:  1. Asthmatic bronchitis.  2. Rhinitis.  3. Remote history of seizures.  4. Chronic intermittent hemoptysis.   HISTORY:  He had seen Dr. Sherene Sires here a week and a half ago.  Symbicort  did not work.  He just felt more congested through the spring and  summer, initially attributed to allergy.  He says by the end of the  day he has a congested sensation in his left chest and he just does not  feel well, but he denies fever, any purulent or bloody discharge or  pain.  He will lie on his right side down and thump on his left chest  which he thinks helps knock loose a little bit of mucus.   MEDICATIONS:  1. Advair 500/50.  2. Lipitor 10 mg.  3. Singulair.  4. Aspirin 81 mg.  5. Mucinex.   No medication allergy.   OBJECTIVE:  Weight 174 pounds.  Blood pressure 120/78, pulse 60.  Room  air saturation 99%.  There are very minimal rhonchi, really just coarse breath sounds.  Work  of breathing is not increased.  Speech quality and pacing is normal with  no stridor.  Heart sounds are normal.  I do not find adenopathy.   IMPRESSION:  Chronic bronchitis with possible bronchiectasis.   PLAN:  Chest x-ray.  We refilled Singulair, refill albuterol.  Stay  active.  Schedule return 6 months, earlier p.r.n. Consider whether a  chest CT would be justified for evaluation of possible bronchiectasis.     Clinton D. Maple Hudson, MD, Tonny Bollman, FACP  Electronically Signed    CDY/MedQ  DD: 05/25/2007  DT: 05/26/2007  Job #: [id number]

## 2011-04-15 NOTE — Assessment & Plan Note (Signed)
[facility]                             PULMONARY OFFICE NOTE   NAME:[name]                      MRN:          [id number]  DATE:09/27/2007                            DOB:          [birth year]    PROBLEM:  1. Asthmatic bronchitis.  2. Rhinitis.  3. Remote history of seizures.  4. Chronic intermittent hemoptysis.   HISTORY:  [name] had seen Dr. Sherene Sires in my absence in June and says he  feels comfortable now.  He had a prostate biopsy, okay, had pneumonia  about six weeks ago and was treated with Avelox.  Chest x-ray was done  then.  He says he did best on Advair 500 as compared with the 250/50  strength or with Symbicort.  He has had Pneumococcal vaccine three  times.  Large local reaction on the third treatment and I told him not  to get it again.  He has had a flu vaccine this fall.  He gets some of  his med's through the [place].  Currently there is little sputum and he feels  stable, not noticing wheeze or much daily cough.   MEDICATIONS:  1. Advair 500/50.  2. Lipitor 10 mg.  3. Singulair.  4. Aspirin 81 mg.  5. Mucinex.  6. P.r.n. use of an albuterol inhaler.  7. He does have a flutter valve which he plays with occasionally.   ALLERGIES:  No medication allergies.   CLINICAL DATA:  Chest x-ray here May 25, 2007 had shown mild changes of  chronic bronchitis versus asthma but no acute cardiopulmonary problem  compared with October of 2007.  Atherosclerotic changes were noted in  the thoracic aorta.   HABITS:  He has never smoked.   OBJECTIVE:  VITAL SIGNS:  Weight 180 pounds.  Blood pressure 112/76.  Pulse 70.  Room air saturation 97%.  CHEST: Sounds clear and breathing seems unlabored.  I do not hear rales  or crackles, even in the dependent bases.  NECK:  I do not find adenopathy or edema.  HEART:  Sounds are regular without murmurs.   IMPRESSION:  Asthmatic bronchitis with some question of bronchiectasis  which would  probably require CT scan for definitive identification.  Additional component of rhinitis is currently not active.   PLAN:  Continue present med's with some discussion again of Advair and  steroid therapy.  Schedule return in one year or earlier p.r.n.     Patrick D. Maple Hudson, MD, [name], FACP  Electronically Signed    CDY/MedQ  DD: 10/03/2007  DT: 10/04/2007  Job #: [id number]   cc:   Dr. Charmaine Downs

## 2011-04-15 NOTE — Assessment & Plan Note (Signed)
[facility]                             PULMONARY OFFICE NOTE   NAME:[name], [name]                      MRN:          [id number]  DATE:09/27/2007                            DOB:          1935/06/26    PROBLEM:  1. Asthmatic bronchitis.  2. Rhinitis.  3. Remote history of seizures.  4. Chronic intermittent hemoptysis.   HISTORY:  [name] had seen Dr. Sherene Sires in my absence in June and says he  feels comfortable now.  He had a prostate biopsy, okay, had pneumonia  about six weeks ago and was treated with Avelox.  Chest x-ray was done  then.  He says he did best on Advair 500 as compared with the 250/50  strength or with Symbicort.  He has had Pneumococcal vaccine three  times.  Large local reaction on the third treatment and I told him not  to get it again.  He has had a flu vaccine this fall.  He gets some of  his med's through the [place].  Currently there is little sputum and he feels  stable, not noticing wheeze or much daily cough.   MEDICATIONS:  1. Advair 500/50.  2. Lipitor 10 mg.  3. Singulair.  4. Aspirin 81 mg.  5. Mucinex.  6. P.r.n. use of an albuterol inhaler.  7. He does have a flutter valve which he plays with occasionally.   ALLERGIES:  No medication allergies.   CLINICAL DATA:  Chest x-ray here May 25, 2007 had shown mild changes of  chronic bronchitis versus asthma but no acute cardiopulmonary problem  compared with October of 2007.  Atherosclerotic changes were noted in  the thoracic aorta.   HABITS:  He has never smoked.   OBJECTIVE:  VITAL SIGNS:  Weight 180 pounds.  Blood pressure 112/76.  Pulse 70.  Room air saturation 97%.  CHEST: Sounds clear and breathing seems unlabored.  I do not hear rales  or crackles, even in the dependent bases.  NECK:  I do not find adenopathy or edema.  HEART:  Sounds are regular without murmurs.   IMPRESSION:  Asthmatic bronchitis with some question of bronchiectasis  which would  probably require CT scan for definitive identification.  Additional component of rhinitis is currently not active.   PLAN:  Continue present med's with some discussion again of Advair and  steroid therapy.  Schedule return in one year or earlier p.r.n.     Clinton D. Maple Hudson, MD, FCCP, FACP     CDY/MedQ  DD: 10/03/2007  DT: 10/04/2007  Job #: [id number]   cc:   Dr. Charmaine Downs

## 2011-04-18 NOTE — Letter (Signed)
February 23, 2007     RE:  [name], [name]  MRN:  [id number]  /  DOB:  [birth year]   SS#: [id number]   To Whom It May Concern:   Mr. [name] is a veteran followed by me for asthmatic bronchitis,  allergic rhinitis and chronic intermittent hemoptysis. He also has a  remote history of seizure disorder. He has been followed within my  practice for many years. Pulmonary function testing in the past has  shown moderate obstructive airways disease with an FEV1 of 2600 (67% of  predicted), ratio of 0.76 as of September 21, 2002. He wears a mask to  avoid respiratory irritants when trying to work outdoors. [name] has  been more stable in recent years, but during acute exacerbations of his  asthmatic bronchitis, he is too ill to drive himself the distances  necessary to reach [facility] or [place]. His wife does very limited rural  driving only. I hope you can take this into consideration when making  decisions about his ability to receive acute care at [facility] facilities  distant from his home.    Sincerely,      Clinton D. Maple Hudson, MD, Tonny Bollman, FACP  Electronically Signed    CDY/MedQ  DD: 02/23/2007  DT: 02/23/2007  Job #: [id number]   CC:    [name]

## 2011-04-18 NOTE — Assessment & Plan Note (Signed)
[facility]                               PULMONARY OFFICE NOTE   NAME:HICKSJibri, Schriefer                      MRN:          [id number]  DATE:09/15/2006                            DOB:          1935/09/17    PROBLEM:  1. Asthmatic bronchitis.  2. Rhinitis.  3. Remote history of seizures.  4. Chronic intermittent hemoptysis.   HISTORY:  One-month followup.  He complains of a sense of pressure or  discomfort rather diffusely over the left lateral rib cage area.  He says he  can tap on it and generate a productive cough.  His impression is that he  is knocking something loose.  He has noted minimal, if any, increased  shortness of breath.  Sputum is always clear.  He has had no fever and no  bleeding.  This has been going on 2 or 3 weeks and he thinks it is gradually  gotten a little worse.  His wife has tried some chest percussion and they  thought that there was trapped mucus he was knocking loose.  He has had  Pneumococcal vaccine 3 times in the past and had a large local reaction on  the last one.   MEDICATION:  1. Advair 500/50.  2. Lipitor 10 mg.  3. Singulair.  4. Albuterol rescue inhaler used occasionally.  He stopped allergy vaccine      in June.  5. Aspirin 81 mg.   No medication allergy.   OBJECTIVE:  Weight 185 pounds.  BP 128/78.  Pulse regular 66.  Room air  saturation 96%.  Breathing is unlabored.  I cannot detect any difference between the two  sides of his chest except there may be a few trace crackles in the left  lower lateral area with no dullness, no rub and chest wall tenderness.  His nasal airway is clear with no postnasal drainage seen.  Voice quality is  normal.  There is no stridor.  No neck vein distention.  HEART:  Sounds are regular without murmur or gallop.  His left upper  quadrant appears nontender.  EXTREMITIES:  Are without cyanosis, clubbing or edema.   IMPRESSION:  This seems like an exacerbation of  chronic bronchitis.  He may  have a little bronchiectasis which would fit with his history of  intermittent chronic hemoptysis, but I cannot tell if there is any new major  event going on.   PLAN:  1. He is given a nebulizer treatment Xopenex 1.25 mg and Depo-Medrol 80 mg      IM with steroid talk done.  2. He is given a Flutter valve with instructions to use it b.i.d.  3. Flu vaccine is given.  4. He is sent for chest x-ray on the way home.  5. Schedule return one year.  6. If he fails to clear, he is to return in the next week or two as      understood.       Clinton D. Maple Hudson, MD, FCCP, FACP      CDY/MedQ  DD:  09/16/2006  DT:  09/17/2006  Job #:  [id number]   cc:   Charmaine Downs

## 2011-04-18 NOTE — Assessment & Plan Note (Signed)
[facility]                               PULMONARY OFFICE NOTE   NAME:HICKSZacariah, Belue                      MRN:          [id number]  DATE:08/11/2006                            DOB:          1935/06/23    PROBLEM:  1. Asthmatic bronchitis.  2. Rhinitis.  3. Remote history of seizures.  4. Chronic intermittent hemoptysis.   HISTORY:  This never smoker was last here in December of 2006 when talked  again about occasional streak hemoptysis which has happened for years and is  attributed to mild hemorrhagic bronchitis.  His wife called in June saying  that he was frequently forgetting his allergy vaccine injections and only  getting them about once a month and then had a local reaction in May.  He  had been hospitalized for heart and blood pressure problems and for  epistaxis.  We stopped his allergy vaccine and asked him to come in.  He  feels he is coughing up some clear mucus from the left lower part of his  chest.  He spends most of each day out doors.  There has been no recent  blood, no chest pain, fever, or other bleeding, and no acute problems.  He  has had pneumococcal vaccine three times and on the last one had a large  local reaction to that.   MEDICATIONS:  1. Advair 500/50.  2. Lipitor 10 mg.  3. Singulair.  4. Albuterol inhaler.  5. Aspirin 81 mg.  6. Amoxicillin 500 mg t.i.d. used occasionally.   ALLERGIES:  NO MEDICATION ALLERGY.   OBJECTIVE:  VITAL SIGNS:  Weight 184 pounds.  Blood pressure 132/70, pulse  regular at 64, room air saturation 97%.  SKIN:  There are some benign-appearing ecchymoses on his lower arms which  they say a dermatologist attributed to sun damage.  NECK:  No adenopathy.  LUNGS:  Lung fields are clear.  HEENT:  Nasal airway is clear with no evidence of bleeding.  HEART:  Heart sounds are regular without murmur or gallop.   IMPRESSION:  1. Asthmatic bronchitis.  2. Possible bronchiectasis, left lower  lobe, from his description and the      occasional bleed.  3. Stable chronic intermittent streak hemoptysis or epistaxis.  4. Consider the possibility that bleeding reflects the steroids in his      Advair 500 as well as the low-dose aspirin.   PLAN:  1. Stay off of allergy vaccine and watch.  2. We discussed the possibility of dropping Advair from 500 to 250/50, but      he wanted to stay where he is.  3. Schedule return in one year, anticipating chest x-ray at that time,      earlier p.r.n.                                   Clinton D. Maple Hudson, MD, FCCP, FACP   CDY/MedQ  DD:  08/15/2006  DT:  08/17/2006  Job #:  [id number]   cc:  Charmaine Downs, M.D., [place], [place]

## 2011-04-22 ENCOUNTER — Telehealth: Payer: Self-pay | Admitting: Internal Medicine

## 2011-04-22 NOTE — Telephone Encounter (Signed)
My intention at last visit was to get him back in August and we would set up CT again at that visit.

## 2011-04-22 NOTE — Telephone Encounter (Signed)
LMTCB

## 2011-04-22 NOTE — Telephone Encounter (Signed)
Spoke with pt's spouse. Pt last seen 03/10/11 with instructions to followup in 6 months, but instructions also read to come back in August and then would have CT chest set up at that time. She is confused and wants to know if he needs to be seen here in august, or just have CT in august. He has no appt's pending. Pls advise thanks!

## 2011-04-23 NOTE — Telephone Encounter (Signed)
ATC again, line busy. No other contact info in chart that is correct. I will sign off an await call-back.Carron Curie, CMA

## 2011-04-23 NOTE — Telephone Encounter (Signed)
ATC number again and line busy. I also tried wok number but pt no there.  Carron Curie, CMA

## 2011-04-23 NOTE — Telephone Encounter (Signed)
ATCx2 line busy. Wcb. Carron Curie, CMA

## 2011-04-23 NOTE — Telephone Encounter (Signed)
ATC pt x 3- fast busy signal, will forward back to triage so they can try again later

## 2011-04-29 ENCOUNTER — Telehealth: Payer: Self-pay | Admitting: Internal Medicine

## 2011-04-29 NOTE — Telephone Encounter (Signed)
LMTCB

## 2011-04-30 NOTE — Telephone Encounter (Signed)
There was a previous phone note, and pt wife wanted to clarify when CY wanted to see the pt again. Per CY he wanted pt to come back in August, so pt set for an appt 07-08-11 at 10am. Carron Curie, CMA

## 2011-04-30 NOTE — Telephone Encounter (Signed)
Spouse returned call. She asks that nurse call her before 2pm today (she has to leave the house after that).

## 2011-07-08 ENCOUNTER — Ambulatory Visit: Payer: Medicare Other | Admitting: Internal Medicine

## 2011-08-01 ENCOUNTER — Encounter: Payer: Self-pay | Admitting: Internal Medicine

## 2011-08-01 ENCOUNTER — Ambulatory Visit (INDEPENDENT_AMBULATORY_CARE_PROVIDER_SITE_OTHER): Payer: Medicare Other | Admitting: Internal Medicine

## 2011-08-01 VITALS — BP 132/86 | HR 62 | Ht 71.0 in | Wt 178.2 lb

## 2011-08-01 DIAGNOSIS — R918 Other nonspecific abnormal finding of lung field: Secondary | ICD-10-CM

## 2011-08-01 DIAGNOSIS — J984 Other disorders of lung: Secondary | ICD-10-CM

## 2011-08-01 DIAGNOSIS — J42 Unspecified chronic bronchitis: Secondary | ICD-10-CM

## 2011-08-01 NOTE — Patient Instructions (Signed)
Order- CT chest w/o contrast- dx lung nodules to be done in March, 2013.   Compare with priors   Continue use of the Flutter device.  We talked about possibly trying an anti-inflammatory medicine called Daliresp

## 2011-08-01 NOTE — Assessment & Plan Note (Addendum)
Mild chronic bronchitis. His symptoms and anxiety about this seem extreme for objective findings. Encourage use of Flutter for pulmonary toilet. I introduced the availability of Daliresp, which we can try later if needed.

## 2011-08-01 NOTE — Progress Notes (Signed)
Subjective:    Patient ID: [name], male    DOB: 09/25/1935, 76 y.o.   MRN: [id number]  HPI    Review of Systems     Objective:   Physical Exam        Assessment & Plan:   Subjective:    Patient ID: [name], male    DOB: [birth year], 76 y.o.   MRN: [id number]  HPI 03/10/2011 -66 yoM never smoker, here with wife for f/u of chronic bronchitis with small lung nodules. Cough remains productive, if he beats on his own chest, especially at night.He indicates he is feeling much better about the cough and sense of mucus needing to be dislodged- may be time of year.   We reviewed results of Chest CT 02/04/11 showing a few small nodules. Radiologist recommended f/u 6-12 months. He has a family member who is a primary care physician who suggested a PET scan, but I explained that these lesions are too small at this point.   08/01/11- 76 yoM never smoker, here with wife for f/u of chronic bronchitis, with small lung nodules. In general has been doing well over the summer. In last 10 days he blames allergy for making him not sleep as well because of chest congestion, beating on his chest again to loosen secretions. Wears mask when outside. Hasn't needed to use his nebulizer much at all. Does use Flutter device every night. CT- 02/04/11 had shown small nodules, now discussed when to reassess.    Review of Systems See HPI Constitutional:   No weight loss, night sweats,  Fevers, chills, fatigue, lassitude. HEENT:   No headaches,  Difficulty swallowing,  Tooth/dental problems,  Sore throat,                No sneezing, itching, ear ache, nasal congestion, post nasal drip,  CV:  No chest pain,  Orthopnea, PND, swelling in lower extremities, anasarca, dizziness, palpitations GI  No heartburn, indigestion, abdominal pain, nausea, vomiting, diarrhea, change in bowel habits, loss of appetite Resp:Some shortness of breath with exertion and at rest.  No excess mucus, but productive cough if he  does aggressive chest PT on himself, thumping chest. ,  No non-productive cough,  No coughing up of blood.  No change in color of mucus.  No wheezing.  Skin: no rash or lesions. GU: no dysuria, change in color of urine, no urgency or frequency.  No flank pain. MS:  No joint pain or swelling.  No decreased range of motion.  No back pain. Psych:  No change in mood or affect. No depression or anxiety.  No memory loss.      Objective:   Physical Exam General- Alert, Oriented, Affect-appropriate, Distress- none acute Skin- rash-none, lesions- none, excoriation- none Lymphadenopathy- none Head- atraumatic            Eyes- Gross vision intact, PERRLA, conjunctivae clear secretions, periorbital edema/bags under his eyes            Ears- Hearing, canals-normal            Nose- Clear, no-Septal dev, mucus, polyps, erosion, perforation             Throat- Mallampati II , mucosa clear , drainage- none, tonsils- atrophic Neck- flexible , trachea midline, no stridor , thyroid nl, carotid no bruit Chest - symmetrical excursion , unlabored           Heart/CV- RRR , no murmur , no gallop  , no rub,  nl s1 s2                           - JVD- none , edema- none, stasis changes- none, varices- none           Lung- clear to P&A, wheeze- none, cough- none , dullness-none, rub- none. I hear no suggestion of crackles rhonchi or wheeze.           Chest wall-  Abd- tender-no, distended-no, bowel sounds-present, HSM- no Br/ Gen/ Rectal- Not done, not indicated Extrem- cyanosis- none, clubbing, none, atrophy- none, strength- nl Neuro- grossly intact to observation        Assessment & Plan:

## 2011-08-04 NOTE — Assessment & Plan Note (Signed)
We discussed the radiology suggestion that his March CT scan, that he be restudied in 6-12 months. He and his wife choose to wait for a full 12 months since he has felt so well over the summer.

## 2012-02-10 ENCOUNTER — Other Ambulatory Visit: Payer: Medicare Other

## 2012-03-15 ENCOUNTER — Telehealth: Payer: Self-pay | Admitting: Internal Medicine

## 2012-03-15 ENCOUNTER — Encounter: Payer: Self-pay | Admitting: Internal Medicine

## 2012-03-15 ENCOUNTER — Ambulatory Visit (INDEPENDENT_AMBULATORY_CARE_PROVIDER_SITE_OTHER): Payer: Medicare Other | Admitting: Internal Medicine

## 2012-03-15 VITALS — BP 130/86 | HR 70 | Ht 71.0 in | Wt 172.0 lb

## 2012-03-15 DIAGNOSIS — R569 Unspecified convulsions: Secondary | ICD-10-CM

## 2012-03-15 DIAGNOSIS — J984 Other disorders of lung: Secondary | ICD-10-CM

## 2012-03-15 DIAGNOSIS — J209 Acute bronchitis, unspecified: Secondary | ICD-10-CM

## 2012-03-15 MED ORDER — DOXYCYCLINE HYCLATE 100 MG PO TABS: ORAL_TABLET | ORAL | Status: DC

## 2012-03-15 NOTE — Progress Notes (Signed)
Patient ID: [name], male    DOB: 03-Jun-1935, 77 y.o.   MRN: [id number]  HPI 03/10/2011 -77 yoM never smoker, here with wife for f/u of chronic bronchitis with small lung nodules. Cough remains productive, if he beats on his own chest, especially at night.He indicates he is feeling much better about the cough and sense of mucus needing to be dislodged- may be time of year.   We reviewed results of Chest CT 02/04/11 showing a few small nodules. Radiologist recommended f/u 6-12 months. He has a family member who is a primary care physician who suggested a PET scan, but I explained that these lesions are too small at this point.   08/01/11- 77 yoM never smoker, here with wife for f/u of chronic bronchitis, with small lung nodules. In general has been doing well over the summer. In last 10 days he blames allergy for making him not sleep as well because of chest congestion, beating on his chest again to loosen secretions. Wears mask when outside. Hasn't needed to use his nebulizer much at all. Does use Flutter device every night. CT- 02/04/11 had shown small nodules, now discussed when to reassess.   03/15/12- 77 yoM never smoker, here with wife for f/u of chronic bronchitis, with small lung nodules.  Wife here. Acute visit- 1 week ago-started not feeling well-had seizure, ? Flu-had symptoms; started Zpak. Has laid in bed and kept eating chicken soup and fluids; has since started sitting up and eating better. Having center chest pain and increased chest congestion. He has a history of occasional mild seizure especially if stressed or sick. After a good winter, 10 days ago he was acutely ill with a bronchitis syndrome. Wife was concerned he might have pneumonia. He took a Z-Pak after his seizure. Gradually felt better for 2 or 3 days has had a lot of time in bed. Relapsed with hoarseness, soreness of the anterior chest "uncomfortable" not really pain. Some thick sputum, light yellow. No blood or swollen  glands, palpitation or anginal pain. He is already scheduled for followup chest CT April 30 for diagnosis of lung nodule. Using his nebulizer machine twice daily.   Review of Systems-See HPI Constitutional:   No weight loss, night sweats,  Fevers, chills, fatigue, lassitude. HEENT:   No headaches,  Difficulty swallowing,  Tooth/dental problems,  Sore throat,                No sneezing, itching, ear ache, nasal congestion, post nasal drip,  CV:  No chest pain,  Orthopnea, PND, swelling in lower extremities, anasarca, dizziness, palpitations GI  No heartburn, indigestion, abdominal pain, nausea, vomiting, diarrhea, change in bowel habits, loss of appetite Resp:Some shortness of breath with exertion and at rest.  No excess mucus,  No non-productive cough,  No coughing up of blood.  No change in color of mucus.  No wheezing.  Skin: no rash or lesions. GU: no dysuria,  MS:  No joint pain or swelling.  No decreased range of motion.  No back pain. Psych:  No change in mood or affect. No depression or anxiety.  No memory loss.   Objective:   Physical Exam General- Alert, Oriented, Affect-appropriate, Distress- none acute Skin- rash-none, lesions- none, excoriation- none Lymphadenopathy- none Head- atraumatic            Eyes- Gross vision intact, PERRLA, conjunctivae clear secretions, periorbital edema/bags under his eyes            Ears- Hearing,  canals-normal            Nose- Clear, no-Septal dev, mucus, polyps, erosion, perforation             Throat- Mallampati III , mucosa- mild thrush , drainage- none, tonsils- atrophic Neck- flexible , trachea midline, no stridor , thyroid nl, carotid no bruit Chest - symmetrical excursion , unlabored           Heart/CV- RRR , no murmur , no gallop  , no rub, nl s1 s2                           - JVD- none , edema- none, stasis changes- none, varices- none           Lung- clear to P&A, wheeze- none, cough- none , dullness-none, rub- none. I hear no  suggestion of crackles rhonchi or wheeze.           Chest wall-  Abd-  Br/ Gen/ Rectal- Not done, not indicated Extrem- cyanosis- none, clubbing, none, atrophy- none, strength- nl Neuro- grossly intact to observation

## 2012-03-15 NOTE — Patient Instructions (Signed)
Script sent for doxycycline  Please call as needed  Keep appointment for Ct scan of chest as scheduled

## 2012-03-15 NOTE — Telephone Encounter (Signed)
Spoke with patient and he is c/o "pain in his lungs", increased chest congestion and cough x 3 days. Pt is requesting an appt today. appt set for today at 2:30pm. Carron Curie, CMA

## 2012-03-19 NOTE — Assessment & Plan Note (Signed)
For followup CT scan in April as scheduled.

## 2012-03-19 NOTE — Assessment & Plan Note (Signed)
Acute tracheobronchitis recently treated with Z-Pak. Plan-fluids, doxycycline

## 2012-03-30 ENCOUNTER — Ambulatory Visit (INDEPENDENT_AMBULATORY_CARE_PROVIDER_SITE_OTHER)
Admission: RE | Admit: 2012-03-30 | Discharge: 2012-03-30 | Disposition: A | Discharge status: Home / Self Care | Payer: Medicare Other | Source: Ambulatory Visit | Attending: Internal Medicine | Admitting: Internal Medicine

## 2012-03-30 DIAGNOSIS — R918 Other nonspecific abnormal finding of lung field: Secondary | ICD-10-CM

## 2012-03-31 ENCOUNTER — Telehealth: Payer: Self-pay | Admitting: Internal Medicine

## 2012-03-31 DIAGNOSIS — J984 Other disorders of lung: Secondary | ICD-10-CM

## 2012-03-31 NOTE — Telephone Encounter (Signed)
[name] was calling to inform pt:  Notes Recorded by Waymon Budge, MD on 03/30/2012 at 4:59 PM CT is mostly stable. There is a nodule in the right lower lung that the radiologist feels should be looked at with a repeat CT chest ( no contrast) in 3 months.  Please order noncontrast CT chest for dx lung nodule, in 3 months (July)    Called, spoke with pt.  I informed him of CT Chest results and recs as stated above per Dr. Maple Hudson.  He verbalized understanding of this and is aware he will receive another call to schedule the CT Chest for July.

## 2012-04-09 NOTE — Progress Notes (Signed)
Quick Note:  Pt aware of results already. ______

## 2012-06-30 ENCOUNTER — Ambulatory Visit (INDEPENDENT_AMBULATORY_CARE_PROVIDER_SITE_OTHER)
Admission: RE | Admit: 2012-06-30 | Discharge: 2012-06-30 | Disposition: A | Discharge status: Home / Self Care | Payer: Medicare Other | Source: Ambulatory Visit | Attending: Internal Medicine | Admitting: Internal Medicine

## 2012-06-30 DIAGNOSIS — J984 Other disorders of lung: Secondary | ICD-10-CM

## 2012-07-02 NOTE — Progress Notes (Signed)
Quick Note:  Pt aware of results and scheduled OV with CY 01-04-13 at 1015am. ______

## 2013-01-04 ENCOUNTER — Ambulatory Visit: Payer: Medicare Other | Admitting: Internal Medicine

## 2013-02-02 ENCOUNTER — Encounter: Payer: Self-pay | Admitting: Internal Medicine

## 2013-02-02 ENCOUNTER — Ambulatory Visit (INDEPENDENT_AMBULATORY_CARE_PROVIDER_SITE_OTHER): Payer: Medicare Other | Admitting: Internal Medicine

## 2013-02-02 VITALS — BP 140/80 | HR 66 | Ht 71.0 in | Wt 184.2 lb

## 2013-02-02 DIAGNOSIS — J984 Other disorders of lung: Secondary | ICD-10-CM

## 2013-02-02 DIAGNOSIS — R918 Other nonspecific abnormal finding of lung field: Secondary | ICD-10-CM

## 2013-02-02 MED ORDER — AZITHROMYCIN 250 MG PO TABS: ORAL_TABLET | ORAL | Status: DC

## 2013-02-02 NOTE — Patient Instructions (Addendum)
Order- CT chest, no contrast     Dx lung nodules  Script for Z pak to hold

## 2013-02-02 NOTE — Progress Notes (Signed)
Patient ID: [name], male    DOB: 08-17-1935, 78 y.o.   MRN: [id number]  HPI 03/10/2011 -39 yoM never smoker, here with wife for f/u of chronic bronchitis with small lung nodules. Cough remains productive, if he beats on his own chest, especially at night.He indicates he is feeling much better about the cough and sense of mucus needing to be dislodged- may be time of year.   We reviewed results of Chest CT 02/04/11 showing a few small nodules. Radiologist recommended f/u 6-12 months. He has a family member who is a primary care physician who suggested a PET scan, but I explained that these lesions are too small at this point.   08/01/11- 76 yoM never smoker, here with wife for f/u of chronic bronchitis, with small lung nodules. In general has been doing well over the summer. In last 10 days he blames allergy for making him not sleep as well because of chest congestion, beating on his chest again to loosen secretions. Wears mask when outside. Hasn't needed to use his nebulizer much at all. Does use Flutter device every night. CT- 02/04/11 had shown small nodules, now discussed when to reassess.   03/15/12- 49 yoM never smoker, here with wife for f/u of chronic bronchitis, with small lung nodules.  Wife here. Acute visit- 1 week ago-started not feeling well-had seizure, ? Flu-had symptoms; started Zpak. Has laid in bed and kept eating chicken soup and fluids; has since started sitting up and eating better. Having center chest pain and increased chest congestion. He has a history of occasional mild seizure especially if stressed or sick. After a good winter, 10 days ago he was acutely ill with a bronchitis syndrome. Wife was concerned he might have pneumonia. He took a Z-Pak after his seizure. Gradually felt better for 2 or 3 days has had a lot of time in bed. Relapsed with hoarseness, soreness of the anterior chest "uncomfortable" not really pain. Some thick sputum, light yellow. No blood or swollen  glands, palpitation or anginal pain. He is already scheduled for followup chest CT April 30 for diagnosis of lung nodule. Using his nebulizer machine twice daily.  02/02/13- 78 yoM never smoker, here with wife for f/u of chronic bronchitis, with small lung nodules.  35 yoM never smoker, here with wife for f/u of chronic bronchitis, with small lung nodules.  Has done very well since last here almost a year ago. They deny any significant respiratory infections. Prostate cancer is being followed on Avodart. CT chest 07/02/12- images were reviewed with them. IMPRESSION:  4 mm nodule in the medial left upper lobe, unchanged from most  recent study, although increased from 2012.  Additional 6 mm left lower lobe nodule and vague ground-glass  nodule/opacity in the right lower lobe, unchanged.  Follow-up CT chest is suggested in 6-12 months to assess for  interval stability.  Original Report Authenticated By: Charline Bills, M.D  Review of Systems-See HPI Constitutional:   No weight loss, night sweats,  Fevers, chills, fatigue, lassitude. HEENT:   No headaches,  Difficulty swallowing,  Tooth/dental problems,  Sore throat,                No sneezing, itching, ear ache, nasal congestion, post nasal drip,  CV:  No chest pain,  Orthopnea, PND, swelling in lower extremities, anasarca, dizziness, palpitations GI  No heartburn, indigestion, abdominal pain, nausea, vomiting, diarrhea, change in bowel habits, loss of appetite Resp:Some shortness of breath with exertion and  at rest.  No excess mucus,  No non-productive cough,  No coughing up of blood.  No change in color of mucus.  No wheezing.  Skin: no rash or lesions. GU: no dysuria,  MS:  No joint pain or swelling.  No decreased range of motion.  No back pain. Psych:  No change in mood or affect. No depression or anxiety.  No memory loss.  Objective:   Physical Exam General- Alert, Oriented, Affect-appropriate, Distress- none acute.exam is unchanged  with no acute findings. Skin- rash-none, lesions- none, excoriation- none Lymphadenopathy- none Head- atraumatic            Eyes- Gross vision intact, PERRLA, conjunctivae clear secretions, periorbital edema/bags under his eyes            Ears- Hearing, canals-normal            Nose- Clear, no-Septal dev, mucus, polyps, erosion, perforation             Throat- Mallampati III , mucosa- mild thrush , drainage- none, tonsils- atrophic Neck- flexible , trachea midline, no stridor , thyroid nl, carotid no bruit Chest - symmetrical excursion , unlabored           Heart/CV- RRR , no murmur , no gallop  , no rub, nl s1 s2                           - JVD- none , edema- none, stasis changes- none, varices- none           Lung- clear to P&A, wheeze- none, cough- none , dullness-none, rub- none. I hear no suggestion of crackles rhonchi or wheeze.           Chest wall-  Abd-  Br/ Gen/ Rectal- Not done, not indicated Extrem- cyanosis- none, clubbing, none, atrophy- none, strength- nl Neuro- grossly intact to observation

## 2013-02-03 ENCOUNTER — Telehealth: Payer: Self-pay | Admitting: Internal Medicine

## 2013-02-03 NOTE — Telephone Encounter (Signed)
Spoke with pt's wife.  States PCCs have been trying to call her regarding scheduling CT Chest.  She is aware CT Chest has been scheduled for May 14.  PCCs, will you pls call pt regarding any special instructions and location of this?  Thank you.

## 2013-02-03 NOTE — Assessment & Plan Note (Signed)
Continue to follow chest CT. We discussed radiologist advice and basis for a followup 6-12 months after the most recent imaging. Plan-followup chest CT 6 or 7 months after the previous, watching for change in lung nodules.

## 2013-02-03 NOTE — Telephone Encounter (Signed)
Pt returned call. Kathleen W Perdue  

## 2013-02-08 NOTE — Telephone Encounter (Signed)
Pt notified ct is 04/13/13@11 :00am Tobe Sos

## 2013-02-09 ENCOUNTER — Other Ambulatory Visit: Payer: Medicare Other

## 2013-03-10 ENCOUNTER — Telehealth: Payer: Self-pay | Admitting: Internal Medicine

## 2013-03-10 NOTE — Telephone Encounter (Signed)
Sorry to say that I did not call the patient. I do not see anything in EPIC or on my desk as a reason to have called the patient. Thanks.

## 2013-03-10 NOTE — Telephone Encounter (Signed)
Spoke with pt's spouse She states that she was on the phone and had another call today, the caller ID gave our number and so she is calling back to see what we called for I advised I do not see where anyone had tried to call, but will check with CDY's nurse to be sure and we will call her back if there is something needed Florentina Addison, please advise thanks!

## 2013-03-10 NOTE — Telephone Encounter (Signed)
Pt advised that we did not call. Carron Curie, CMA

## 2013-04-13 ENCOUNTER — Ambulatory Visit (INDEPENDENT_AMBULATORY_CARE_PROVIDER_SITE_OTHER)
Admission: RE | Admit: 2013-04-13 | Discharge: 2013-04-13 | Disposition: A | Discharge status: Home / Self Care | Payer: Medicare Other | Source: Ambulatory Visit | Attending: Internal Medicine | Admitting: Internal Medicine

## 2013-04-13 DIAGNOSIS — R918 Other nonspecific abnormal finding of lung field: Secondary | ICD-10-CM

## 2013-09-14 ENCOUNTER — Telehealth: Payer: Self-pay | Admitting: Internal Medicine

## 2013-09-14 DIAGNOSIS — R918 Other nonspecific abnormal finding of lung field: Secondary | ICD-10-CM

## 2013-09-14 NOTE — Telephone Encounter (Signed)
Suggest we order CT chest, no contrast    DX lung nodules    To be done in time for December OV.   -Thanks

## 2013-09-14 NOTE — Telephone Encounter (Signed)
Last  CT done 03-2013 with results as follows:  Notes Recorded by Waymon Budge, MD on 04/13/2013 at 2:39 PM CT is mostly stable. There are a couple of areas the radiologist recommends we look at with one more CT in another year. Will discuss at next ov.   CY please advise if you would like to see patient in Dec 2014 and then decide or go ahead and schedule for CT now. Thanks.

## 2013-09-14 NOTE — Telephone Encounter (Signed)
Spoke with patients wife-aware that order has been placed for CT chest no contrast to be done before 11-08-13 OV with CY. They will await a call from our PCC's with date, time, and location.

## 2013-11-04 ENCOUNTER — Ambulatory Visit (INDEPENDENT_AMBULATORY_CARE_PROVIDER_SITE_OTHER)
Admission: RE | Admit: 2013-11-04 | Discharge: 2013-11-04 | Disposition: A | Discharge status: Home / Self Care | Payer: Medicare Other | Source: Ambulatory Visit | Attending: Internal Medicine | Admitting: Internal Medicine

## 2013-11-04 DIAGNOSIS — R918 Other nonspecific abnormal finding of lung field: Secondary | ICD-10-CM

## 2013-11-08 ENCOUNTER — Ambulatory Visit: Payer: Medicare Other | Admitting: Internal Medicine

## 2014-02-23 ENCOUNTER — Encounter: Payer: Self-pay | Admitting: Internal Medicine

## 2014-02-23 ENCOUNTER — Ambulatory Visit (INDEPENDENT_AMBULATORY_CARE_PROVIDER_SITE_OTHER): Payer: Medicare Other | Admitting: Internal Medicine

## 2014-02-23 VITALS — BP 130/78 | HR 64 | Ht 71.0 in | Wt 177.0 lb

## 2014-02-23 DIAGNOSIS — R042 Hemoptysis: Secondary | ICD-10-CM

## 2014-02-23 DIAGNOSIS — J984 Other disorders of lung: Secondary | ICD-10-CM

## 2014-02-23 DIAGNOSIS — J42 Unspecified chronic bronchitis: Secondary | ICD-10-CM

## 2014-02-23 NOTE — Patient Instructions (Signed)
We can see you in a year, unless you need help sooner

## 2014-02-23 NOTE — Progress Notes (Signed)
Patient ID: KATHRYN LINAREZ, male    DOB: 1935/04/02, 79 y.o.   MRN: [id number]  HPI 03/10/2011 -37 yoM never smoker, here with wife for f/u of chronic bronchitis with small lung nodules. Cough remains productive, if he beats on his own chest, especially at night.He indicates he is feeling much better about the cough and sense of mucus needing to be dislodged- may be time of year.   We reviewed results of Chest CT 02/04/11 showing a few small nodules. Radiologist recommended f/u 6-12 months. He has a family member who is a primary care physician who suggested a PET scan, but I explained that these lesions are too small at this point.   08/01/11- 76 yoM never smoker, here with wife for f/u of chronic bronchitis, with small lung nodules. In general has been doing well over the summer. In last 10 days he blames allergy for making him not sleep as well because of chest congestion, beating on his chest again to loosen secretions. Wears mask when outside. Hasn't needed to use his nebulizer much at all. Does use Flutter device every night. CT- 02/04/11 had shown small nodules, now discussed when to reassess.   03/15/12- 28 yoM never smoker, here with wife for f/u of chronic bronchitis, with small lung nodules.  Wife here. Acute visit- 1 week ago-started not feeling well-had seizure, ? Flu-had symptoms; started Zpak. Has laid in bed and kept eating chicken soup and fluids; has since started sitting up and eating better. Having center chest pain and increased chest congestion. He has a history of occasional mild seizure especially if stressed or sick. After a good winter, 10 days ago he was acutely ill with a bronchitis syndrome. Wife was concerned he might have pneumonia. He took a Z-Pak after his seizure. Gradually felt better for 2 or 3 days has had a lot of time in bed. Relapsed with hoarseness, soreness of the anterior chest "uncomfortable" not really pain. Some thick sputum, light yellow. No blood or swollen  glands, palpitation or anginal pain. He is already scheduled for followup chest CT April 30 for diagnosis of lung nodule. Using his nebulizer machine twice daily.  02/02/13- 78 yoM never smoker, here with wife for f/u of chronic bronchitis, with small lung nodules.  60 yoM never smoker, here with wife for f/u of chronic bronchitis, with small lung nodules.  Has done very well since last here almost a year ago. They deny any significant respiratory infections. Prostate cancer is being followed on Avodart. CT chest 07/02/12- images were reviewed with them. IMPRESSION:  4 mm nodule in the medial left upper lobe, unchanged from most  recent study, although increased from 2012.  Additional 6 mm left lower lobe nodule and vague ground-glass  nodule/opacity in the right lower lobe, unchanged.  Follow-up CT chest is suggested in 6-12 months to assess for  interval stability.  Original Report Authenticated By: Julian Hy, M.D  02/23/14-79 yoM never smoker, here with wife for f/u of chronic bronchitis, with small lung nodules, complicated by prostate cancer FOLLOWS FOR: SOB and cough at times; has had a good winter. Likes flonase.  CT chest 11/04/13- IMPRESSION:  1. Stable left lung nodularity from 02/04/2011. The small right  lower lobe ground-glass density also appears stable. These findings  are likely all benign.  2. No new or acute findings.  3. Stable hepatic cysts and gallstones.  Electronically Signed  By: Camie Patience M.D.  On: 11/04/2013 15:36    Review of  Systems-See HPI Constitutional:   No weight loss, night sweats,  Fevers, chills, fatigue, lassitude. HEENT:   No headaches,  Difficulty swallowing,  Tooth/dental problems,  Sore throat,                No sneezing, itching, ear ache, nasal congestion, post nasal drip,  CV:  No chest pain,  Orthopnea, PND, swelling in lower extremities, anasarca, dizziness, palpitations GI  No heartburn, indigestion, abdominal pain, nausea,  vomiting,  Resp:Some shortness of breath with exertion and at rest.  No excess mucus,  No non-productive cough,  No coughing up of blood.  No change in color of mucus.  No wheezing.  Skin: no rash or lesions. GU: no dysuria,  MS:  No joint pain or swelling.  Marland Kitchen Psych:  No change in mood or affect. No depression or anxiety.  No memory loss.  Objective:   Physical Exam General- Alert, Oriented, Affect-appropriate, Distress- none acute.exam is unchanged with no acute findings. Skin- rash-none, lesions- none, excoriation- none Lymphadenopathy- none Head- atraumatic            Eyes- Gross vision intact, PERRLA, conjunctivae clear secretions, periorbital edema/bags under his eyes            Ears- Hearing, canals-normal            Nose- Clear, no-Septal dev, mucus, polyps, erosion, perforation             Throat- Mallampati III , mucosa- mild thrush , drainage- none, tonsils- atrophic Neck- flexible , trachea midline, no stridor , thyroid nl, carotid no bruit Chest - symmetrical excursion , unlabored           Heart/CV- RRR , no murmur , no gallop  , no rub, nl s1 s2                           - JVD- none , edema- none, stasis changes- none, varices- none           Lung- clear to P&A, wheeze- none, cough- none , dullness-none, rub- none.                          Chest wall-  Abd-  Br/ Gen/ Rectal- Not done, not indicated Extrem- cyanosis- none, clubbing, none, atrophy- none, strength- nl Neuro- grossly intact to observation

## 2014-03-22 NOTE — Assessment & Plan Note (Addendum)
Stable small nodules acting benign. He reports his sister has benign lung nodules. Plan- occasional chest xray. He and his wife are comfortable with this approach.

## 2014-03-22 NOTE — Assessment & Plan Note (Signed)
Controlled and doing well

## 2014-03-22 NOTE — Assessment & Plan Note (Signed)
No recent recurrence. 

## 2015-04-25 ENCOUNTER — Telehealth: Payer: Self-pay | Admitting: Internal Medicine

## 2015-04-25 NOTE — Telephone Encounter (Signed)
I am not sure what explains how he feels. If he has a closer doctor, or an urgent care, maybe he should go there. I would recommend a CXR. If he wants to wait and be seen here, then we need to see if he can work in with CXR to see me or TP. If wait is mre than a week, then offer Z pak and suggest Mucinex DM

## 2015-04-25 NOTE — Telephone Encounter (Signed)
Patient has chronic bronchitis and asthma.  Has been doing pretty well, only a few episodes.  Patient is having problems with dry feeling in left lung, non-productive cough, chest congestion, rattling sound in chest, "feels tight in left lung".  Not in any distress.  Patient lives in [facility] and says it is a long drive to come in.  Would like to have something called in.  Patient is aware that Dr. Annamaria Boots has left for the day and says he is not in distress and can wait for Dr. Annamaria Boots to answer his message in the morning.  Allergies  Allergen Reactions  . Pneumococcal Vaccines    Current Outpatient Prescriptions on File Prior to Visit  Medication Sig Dispense Refill  . albuterol (PROVENTIL HFA) 108 (90 BASE) MCG/ACT inhaler Inhale 2 puffs into the lungs every 6 (six) hours as needed.      Marland Kitchen alendronate (FOSAMAX) 70 MG tablet Take 70 mg by mouth every 7 (seven) days. Take with a full glass of water on an empty stomach.     Marland Kitchen aspirin 81 MG tablet Take 81 mg by mouth daily.      Marland Kitchen atorvastatin (LIPITOR) 10 MG tablet Take 10 mg by mouth daily.      . calcium acetate (PHOSLO) 667 MG capsule 1300mg  once daily    . diphenhydrAMINE (SOMINEX) 25 MG tablet Take 25 mg by mouth at bedtime as needed.      . dutasteride (AVODART) 0.5 MG capsule Take 0.5 mg by mouth daily.    . Ergocalciferol (VITAMIN D2) 2000 UNITS TABS Take 1 tablet by mouth daily.      . finasteride (PROSCAR) 5 MG tablet Take 1 tablet by mouth daily.    . flunisolide (NASALIDE) 25 MCG/ACT (0.025%) SOLN Place 2 sprays into the nose 2 (two) times daily.    . Fluticasone-Salmeterol (ADVAIR DISKUS) 500-50 MCG/DOSE AEPB Inhale 1 puff into the lungs every 12 (twelve) hours. Rinse mouth after each use     . guaiFENesin (MUCINEX) 600 MG 12 hr tablet Take 1,200 mg by mouth 2 (two) times daily.     Marland Kitchen ipratropium-albuterol (DUONEB) 0.5-2.5 (3) MG/3ML SOLN Take 3 mLs by nebulization every 6 (six) hours as needed.      . montelukast (SINGULAIR) 10 MG  tablet Take 10 mg by mouth at bedtime.       No current facility-administered medications on file prior to visit.

## 2015-04-26 NOTE — Telephone Encounter (Signed)
lmtcb X1 for pt  

## 2015-04-26 NOTE — Telephone Encounter (Signed)
Pt went to see PCP instead, appt no longer needed

## 2015-04-26 NOTE — Telephone Encounter (Signed)
LMTCB

## 2015-05-07 ENCOUNTER — Telehealth: Payer: Self-pay | Admitting: Internal Medicine

## 2015-05-07 NOTE — Telephone Encounter (Signed)
Patient wife calling again - pt has appt with [facility] 05/08/15 Letter was faxed to our office requesting records - patient has not received a call back from our office. Requesting a call back ASAP from nurse

## 2015-05-07 NOTE — Telephone Encounter (Signed)
Patient's wife says patient has been having problems and is not able to drive here anymore.  Patient has to switch to a provider in [facility] and they need a copy of the CT scan that was done by Dr. Annamaria Boots.  Dr. Tilden Dome @ [facility] - Fax: [phone]  Records faxed to [place]  Patient informed that I can only send last OV and CT results.  Advised patient that he will have to contact our records department in order to get all records submitted to Dr. Tilden Dome since patient has so many records.  Patient verbalized understanding. Nothing further needed.

## 2015-07-19 IMAGING — CT CT CHEST W/O CM
2 of 3 series · 15 of 36 positions shown, 18 images · IV contrast (Omnipaque 300)
Comparison: Chest CTs ranging from 02/04/2011 through 04/13/2013.

CLINICAL DATA: Follow up pulmonary nodules.

EXAM:
CT CHEST WITHOUT CONTRAST
TECHNIQUE: Multidetector CT imaging of the chest was performed following the
standard protocol without IV contrast.

[Series 2: chest routine with · axial · 0.72mm/px · z∈[-318,-38]mm · 12 of 66 slices shown, 15 images]
[im 5/66  mediastinal]
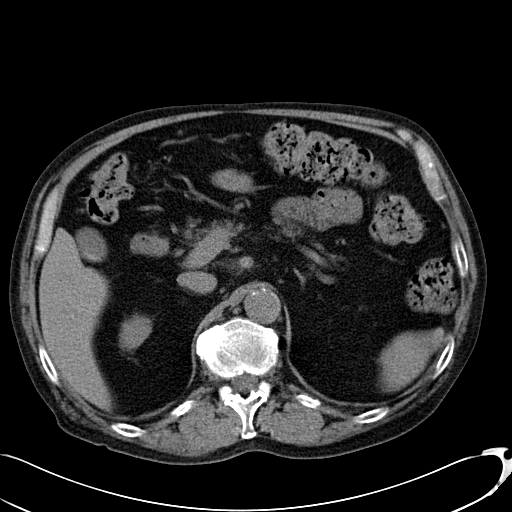
[im 5/66  lung]
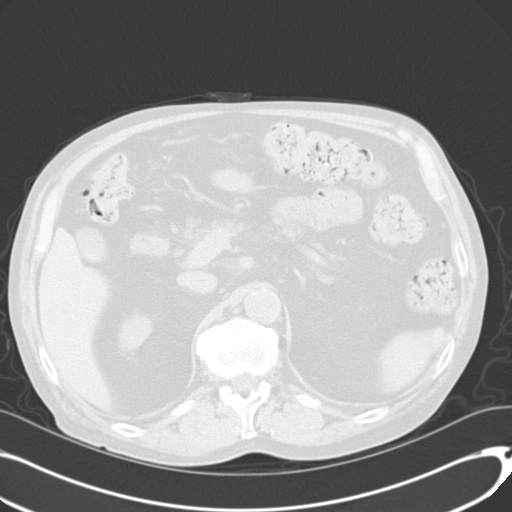
[im 10/66  lung]
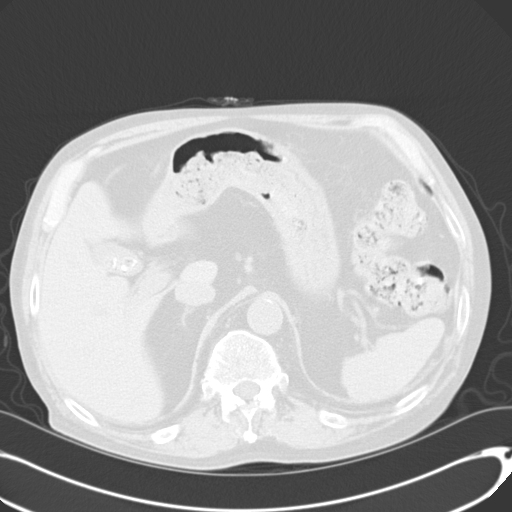
[im 15/66  lung]
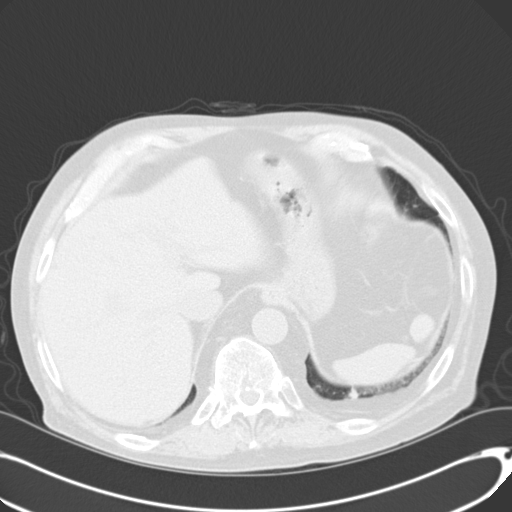
[im 20/66  lung]
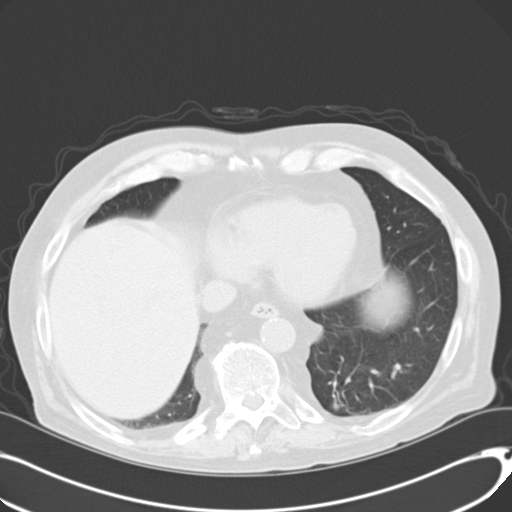
[im 25/66  mediastinal]
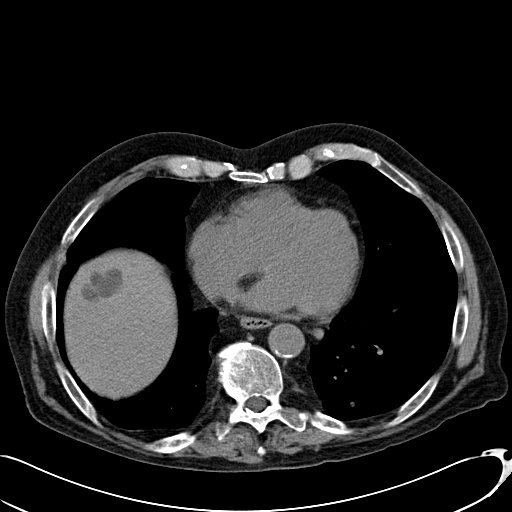
[im 25/66  lung]
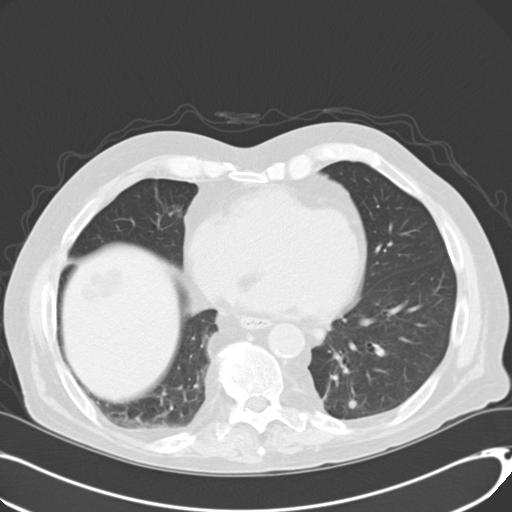
[im 29/66  lung]
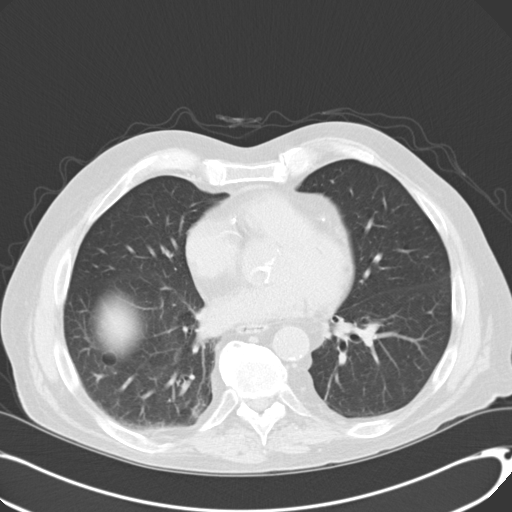
[im 37/66  lung]
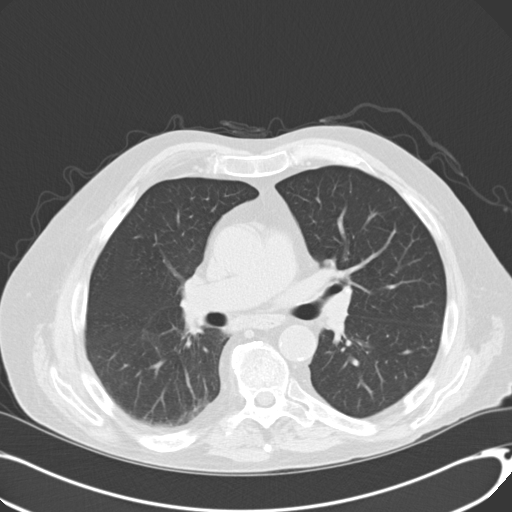
[im 41/66  lung]
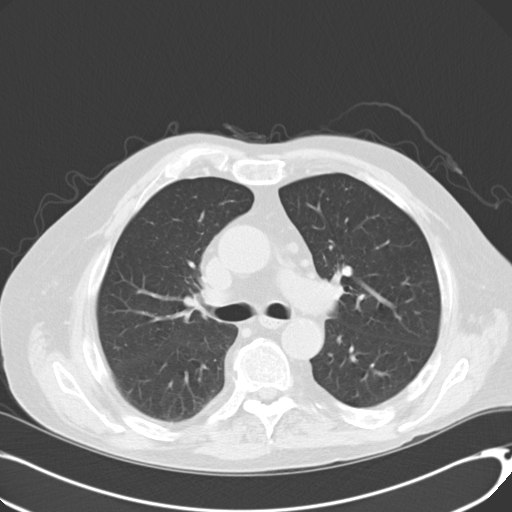
[im 46/66  mediastinal]
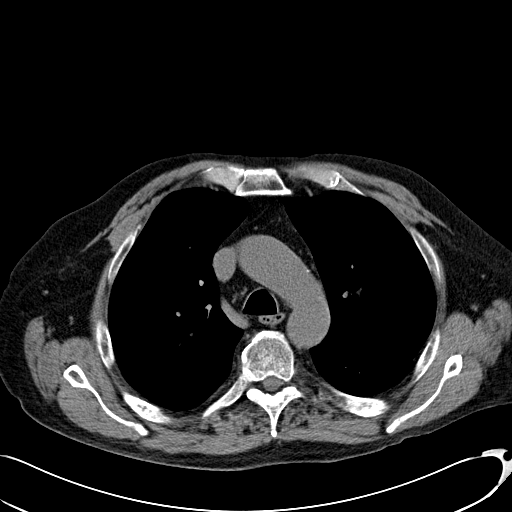
[im 46/66  lung]
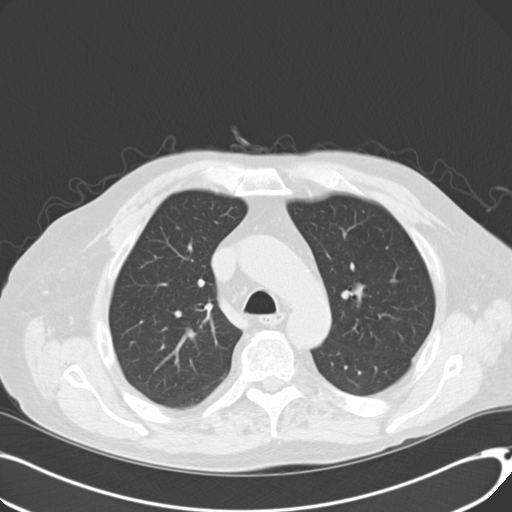
[im 51/66  lung]
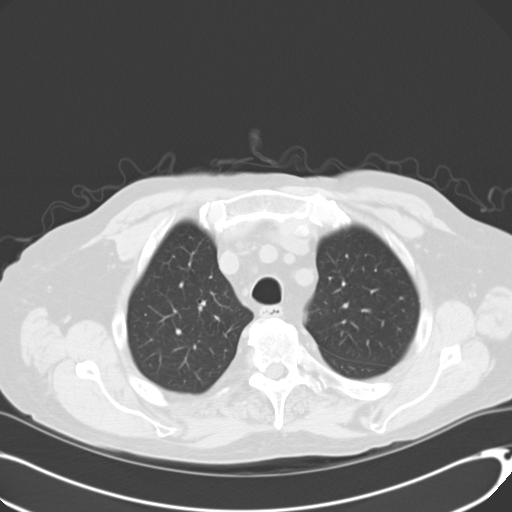
[im 56/66  lung]
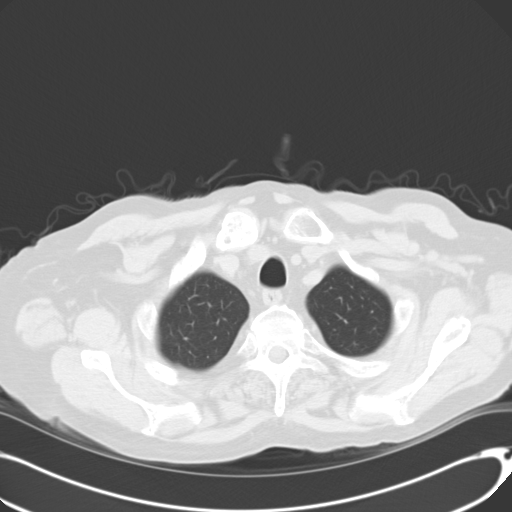
[im 61/66  lung]
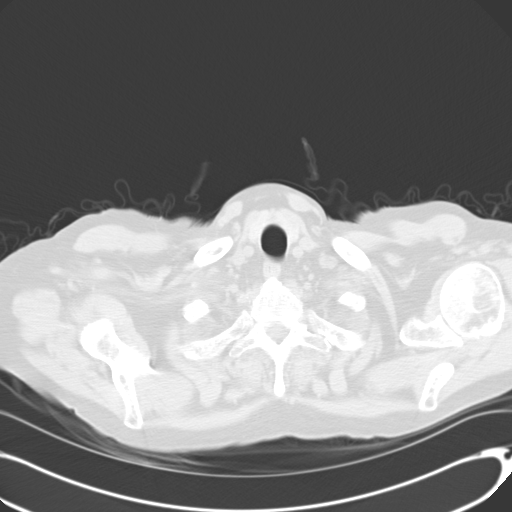

[Series 602: coronals · coronal · 0.72mm/px · 3 of 119 slices shown]
[im 24/119  lung]
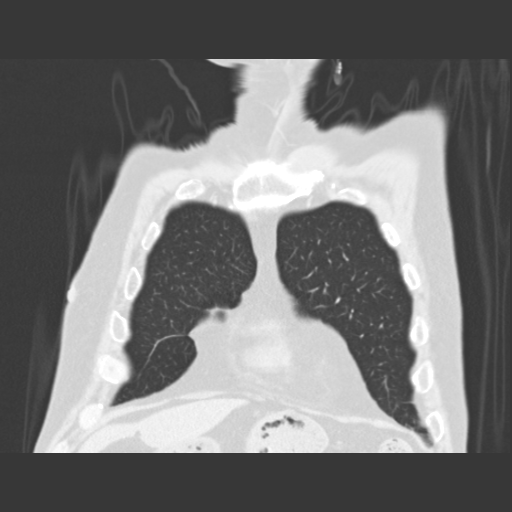
[im 48/119  lung]
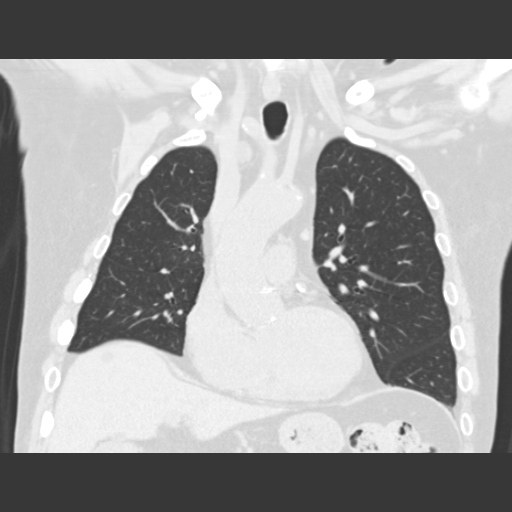
[im 71/119  lung]
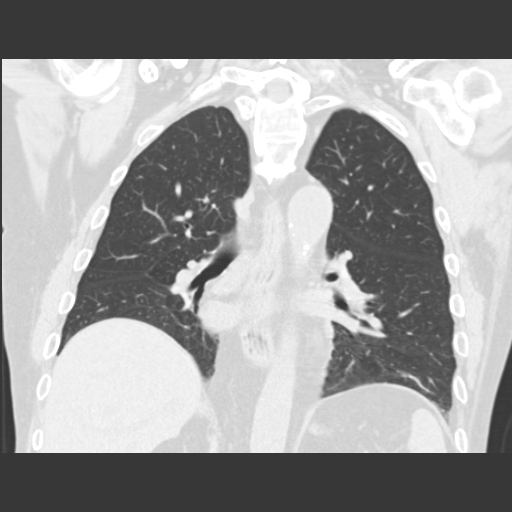

[15 of 36 positions shown; findings below may reference images not displayed]

FINDINGS: Low-density structure in the superior right mediastinum is
unchanged, measuring 16 x 9 mm on image 14. No pathologically
enlarged mediastinal or hilar lymph nodes are identified. There is
stable atherosclerosis of the aorta, coronary arteries and great
vessels.

The 6 mm left lower lobe pulmonary nodule on image number 42 is
unchanged. Tiny left upper lobe nodule on image 19 is also stable.
The ill-defined right lower lobe ground-glass density on image 32 is
not easily measured, although appears stable from the original study
from [id number]. Minimal nodularity in the right lower lobe on image 42 is
stable. There are no new or enlarging pulmonary nodules.

Images through the upper abdomen demonstrate stable hepatic cysts
and calcified gallstones. There is no adrenal mass. Osteophytes are
present throughout the spine. There are no worrisome osseous
findings.
IMPRESSION: 1. Stable left lung nodularity from 02/04/2011. The small right
lower lobe ground-glass density also appears stable. These findings
are likely all benign.
2. No new or acute findings.
3. Stable hepatic cysts and gallstones.

## 2022-03-31 DEATH — deceased
